# Patient Record
Sex: Female | Born: 1955 | Race: Black or African American | Hispanic: No | Smoking: Never smoker
Health system: Southern US, Community
[De-identification: ages and names within clinical notes are randomized; demographics above are authoritative.]

## PROBLEM LIST (undated history)

## (undated) DIAGNOSIS — M1990 Unspecified osteoarthritis, unspecified site: Secondary | ICD-10-CM

## (undated) DIAGNOSIS — C50919 Malignant neoplasm of unspecified site of unspecified female breast: Secondary | ICD-10-CM

## (undated) DIAGNOSIS — IMO0001 Reserved for inherently not codable concepts without codable children: Secondary | ICD-10-CM

## (undated) DIAGNOSIS — E039 Hypothyroidism, unspecified: Secondary | ICD-10-CM

## (undated) DIAGNOSIS — Z789 Other specified health status: Secondary | ICD-10-CM

## (undated) HISTORY — PX: COLONOSCOPY: SHX174

## (undated) HISTORY — PX: BREAST EXCISIONAL BIOPSY: SUR124

## (undated) HISTORY — PX: TUBAL LIGATION: SHX77

## (undated) HISTORY — PX: OTHER SURGICAL HISTORY: SHX169

## (undated) HISTORY — PX: INGUINAL HERNIA REPAIR: SHX194

---

## 1998-06-01 ENCOUNTER — Ambulatory Visit (HOSPITAL_COMMUNITY): Admission: RE | Admit: 1998-06-01 | Discharge: 1998-06-01 | Payer: Self-pay | Admitting: Endocrinology

## 1999-04-24 ENCOUNTER — Ambulatory Visit (HOSPITAL_COMMUNITY): Admission: RE | Admit: 1999-04-24 | Discharge: 1999-04-24 | Payer: Self-pay | Admitting: Obstetrics and Gynecology

## 1999-04-24 ENCOUNTER — Encounter: Payer: Self-pay | Admitting: Obstetrics and Gynecology

## 1999-12-27 ENCOUNTER — Other Ambulatory Visit: Admission: RE | Admit: 1999-12-27 | Discharge: 1999-12-27 | Payer: Self-pay | Admitting: Obstetrics and Gynecology

## 2000-06-27 ENCOUNTER — Other Ambulatory Visit: Admission: RE | Admit: 2000-06-27 | Discharge: 2000-06-27 | Payer: Self-pay | Admitting: Obstetrics and Gynecology

## 2000-07-01 ENCOUNTER — Encounter: Payer: Self-pay | Admitting: Obstetrics and Gynecology

## 2000-07-01 ENCOUNTER — Ambulatory Visit (HOSPITAL_COMMUNITY): Admission: RE | Admit: 2000-07-01 | Discharge: 2000-07-01 | Payer: Self-pay | Admitting: Obstetrics and Gynecology

## 2001-07-11 ENCOUNTER — Other Ambulatory Visit: Admission: RE | Admit: 2001-07-11 | Discharge: 2001-07-11 | Payer: Self-pay | Admitting: Obstetrics and Gynecology

## 2001-08-15 ENCOUNTER — Ambulatory Visit (HOSPITAL_COMMUNITY): Admission: RE | Admit: 2001-08-15 | Discharge: 2001-08-15 | Payer: Self-pay | Admitting: Obstetrics and Gynecology

## 2001-08-15 ENCOUNTER — Encounter: Payer: Self-pay | Admitting: Obstetrics and Gynecology

## 2002-09-23 ENCOUNTER — Other Ambulatory Visit: Admission: RE | Admit: 2002-09-23 | Discharge: 2002-09-23 | Payer: Self-pay | Admitting: Obstetrics and Gynecology

## 2003-02-24 ENCOUNTER — Ambulatory Visit (HOSPITAL_COMMUNITY): Admission: RE | Admit: 2003-02-24 | Discharge: 2003-02-24 | Payer: Self-pay | Admitting: Obstetrics and Gynecology

## 2003-02-24 ENCOUNTER — Encounter: Payer: Self-pay | Admitting: Obstetrics and Gynecology

## 2003-12-13 ENCOUNTER — Other Ambulatory Visit: Admission: RE | Admit: 2003-12-13 | Discharge: 2003-12-13 | Payer: Self-pay | Admitting: Obstetrics and Gynecology

## 2005-01-18 ENCOUNTER — Other Ambulatory Visit: Admission: RE | Admit: 2005-01-18 | Discharge: 2005-01-18 | Payer: Self-pay | Admitting: Obstetrics and Gynecology

## 2013-11-19 HISTORY — PX: BREAST LUMPECTOMY: SHX2

## 2014-04-06 ENCOUNTER — Other Ambulatory Visit: Payer: Self-pay | Admitting: Family Medicine

## 2014-04-06 DIAGNOSIS — M25559 Pain in unspecified hip: Secondary | ICD-10-CM

## 2014-04-15 ENCOUNTER — Ambulatory Visit
Admission: RE | Admit: 2014-04-15 | Discharge: 2014-04-15 | Disposition: A | Discharge status: Home / Self Care | Payer: Self-pay | Source: Ambulatory Visit | Attending: Family Medicine | Admitting: Family Medicine

## 2014-04-15 DIAGNOSIS — M25559 Pain in unspecified hip: Secondary | ICD-10-CM

## 2014-06-02 ENCOUNTER — Other Ambulatory Visit: Payer: Self-pay | Admitting: Obstetrics and Gynecology

## 2014-06-02 DIAGNOSIS — R928 Other abnormal and inconclusive findings on diagnostic imaging of breast: Secondary | ICD-10-CM

## 2014-06-10 ENCOUNTER — Other Ambulatory Visit: Payer: Self-pay | Admitting: Obstetrics and Gynecology

## 2014-06-10 ENCOUNTER — Ambulatory Visit
Admission: RE | Admit: 2014-06-10 | Discharge: 2014-06-10 | Disposition: A | Discharge status: Home / Self Care | Payer: Federal, State, Local not specified - PPO | Source: Ambulatory Visit | Attending: Obstetrics and Gynecology | Admitting: Obstetrics and Gynecology

## 2014-06-10 DIAGNOSIS — R921 Mammographic calcification found on diagnostic imaging of breast: Secondary | ICD-10-CM

## 2014-06-10 DIAGNOSIS — R928 Other abnormal and inconclusive findings on diagnostic imaging of breast: Secondary | ICD-10-CM

## 2014-06-18 ENCOUNTER — Ambulatory Visit
Admission: RE | Admit: 2014-06-18 | Discharge: 2014-06-18 | Disposition: A | Discharge status: Home / Self Care | Payer: Federal, State, Local not specified - PPO | Source: Ambulatory Visit | Attending: Obstetrics and Gynecology | Admitting: Obstetrics and Gynecology

## 2014-06-18 DIAGNOSIS — R921 Mammographic calcification found on diagnostic imaging of breast: Secondary | ICD-10-CM

## 2014-06-21 ENCOUNTER — Other Ambulatory Visit: Payer: Self-pay | Admitting: Obstetrics and Gynecology

## 2014-06-21 DIAGNOSIS — C50919 Malignant neoplasm of unspecified site of unspecified female breast: Secondary | ICD-10-CM

## 2014-06-22 ENCOUNTER — Telehealth: Payer: Self-pay | Admitting: *Deleted

## 2014-06-22 DIAGNOSIS — C50411 Malignant neoplasm of upper-outer quadrant of right female breast: Secondary | ICD-10-CM | POA: Insufficient documentation

## 2014-06-22 NOTE — Telephone Encounter (Signed)
Confirmed [facility] for 07/07/14 at 1230pm .  Instructions and contact information given.  Patient states she had labs done a week ago and will bring them with her.  No lab appointment made.

## 2014-06-28 ENCOUNTER — Other Ambulatory Visit: Payer: Self-pay

## 2014-07-07 ENCOUNTER — Encounter: Payer: Self-pay | Admitting: Hematology and Oncology

## 2014-07-07 ENCOUNTER — Encounter: Payer: Self-pay | Admitting: General Practice

## 2014-07-07 ENCOUNTER — Ambulatory Visit: Payer: Federal, State, Local not specified - PPO

## 2014-07-07 ENCOUNTER — Ambulatory Visit
Admission: RE | Admit: 2014-07-07 | Discharge: 2014-07-07 | Disposition: A | Discharge status: Home / Self Care | Payer: Federal, State, Local not specified - PPO | Source: Ambulatory Visit | Attending: Radiation Oncology | Admitting: Radiation Oncology

## 2014-07-07 ENCOUNTER — Ambulatory Visit (HOSPITAL_BASED_OUTPATIENT_CLINIC_OR_DEPARTMENT_OTHER): Payer: Federal, State, Local not specified - PPO | Admitting: Surgery

## 2014-07-07 ENCOUNTER — Ambulatory Visit (HOSPITAL_BASED_OUTPATIENT_CLINIC_OR_DEPARTMENT_OTHER): Payer: Federal, State, Local not specified - PPO | Admitting: Hematology and Oncology

## 2014-07-07 VITALS — BP 162/76 | HR 68 | Temp 97.6°F | Resp 18 | Ht 66.5 in | Wt 232.6 lb

## 2014-07-07 DIAGNOSIS — Z170 Estrogen receptor positive status [ER+]: Secondary | ICD-10-CM

## 2014-07-07 DIAGNOSIS — C50411 Malignant neoplasm of upper-outer quadrant of right female breast: Secondary | ICD-10-CM

## 2014-07-07 DIAGNOSIS — D0590 Unspecified type of carcinoma in situ of unspecified breast: Secondary | ICD-10-CM

## 2014-07-07 DIAGNOSIS — D0510 Intraductal carcinoma in situ of unspecified breast: Secondary | ICD-10-CM | POA: Insufficient documentation

## 2014-07-07 DIAGNOSIS — D0511 Intraductal carcinoma in situ of right breast: Secondary | ICD-10-CM

## 2014-07-07 NOTE — Progress Notes (Signed)
Checked in new pt with no financial concerns at this time.  I informed pt of the [facility] program and what they assist with.  I gave her my card for any questions or concerns.

## 2014-07-07 NOTE — Progress Notes (Signed)
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 female  Who was found to have calcifications on a screening mammogram which measured 8 mm. 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 She declined an MRI. She was referred to breast clinic.  She has sensitive breasts her whole life and the right breast is particularly sensitive after her biopsy. She is accompanied by her sister. She is GXP3 with her first live birth at the age of 62. Menses began at age 61 and she is perimenopausal with some spotting only over the past 12 months. She is interested in breast conservation.  Her sister tape recorded this conversation.   PREVIOUS RADIATION THERAPY: No  PAST MEDICAL HISTORY:  has a past medical history of Graves' disease.    PAST SURGICAL HISTORY: Past Surgical History  Procedure Laterality Date  . Tubal ligation    . Fibrocystic breast Left     FAMILY HISTORY:  Family History  Problem Relation Age of Onset  . Breast cancer Maternal Aunt   . Liver cancer Maternal Uncle     SOCIAL HISTORY:  History  Substance Use Topics  . Smoking status: Never Smoker   . Smokeless tobacco: Not on file  . Alcohol Use: Yes     Comment: 1-2/week    ALLERGIES: Review of patient's allergies indicates no known allergies.  MEDICATIONS:  No current outpatient prescriptions on file.   No current facility-administered medications for this encounter.    REVIEW OF SYSTEMS:  A 15 point review of  systems is documented in the electronic medical record. This was obtained by the nursing staff. However, I reviewed this with the patient to discuss relevant findings and make appropriate changes.  Pertinent items are noted in HPI.  PHYSICAL EXAM: There were no vitals filed for this visit.. . Pleasant female in no distress. Sensitive breasts with large areola bilaterally. Minimal bruising in the upper outer quadrant  LABORATORY DATA:  No results found for this basename: WBC, HGB, HCT, MCV, PLT   No results found for this basename: NA, K, CL, CO2   No results found for this basename: ALT, AST, GGT, ALKPHOS, BILITOT     RADIOGRAPHY: Mm Digital Diagnostic Unilat R  06/10/2014   CLINICAL DATA:  Call back from screening for right breast calcifications.  EXAM: DIGITAL DIAGNOSTIC  RIGHT MAMMOGRAM  COMPARISON:  Prior exams  ACR Breast Density Category b: There are scattered areas of fibroglandular density.  FINDINGS: A small cluster of calcifications on the right have increased from 2 with 3 faint calcifications in 2014 and no perceptible calcifications the year before that. They appear generally punctate but very in size. There is no branching or linearity. There is no associated mass.  IMPRESSION: Increasing calcifications arranged in a small cluster in the lateral right breast. Suspicious. Biopsy is recommended.  RECOMMENDATION: Stereotactic core needle biopsy of the right breast calcifications. Stereotactic biopsy will be scheduled.  I have discussed the  findings and recommendations with the patient. Results were also provided in writing at the conclusion of the visit. If applicable, a reminder letter will be sent to the patient regarding the next appointment.  BI-RADS CATEGORY  4: Suspicious.   Electronically Signed   By: Lajean Manes M.D.   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 This was found to be concordant by Dr. Luberta Robertson. Pathology was discussed with the patient by telephone and her questions were answered. The patient reported doing well after the biopsy. Post biopsy instructions and care were reviewed. The patient has been scheduled for [facility] on July 07, 2014, and a bilateral breast MRI on June 28, 2014. She was encouraged to come to [facility] for educational materials. My number was provided to her for future questions and concerns.  Pathology results reported by Susa Raring RN, BSN on June 21, 2014.   Electronically Signed   By: Luberta Robertson M.D.   On: 06/21/2014 12:27   06/21/2014   CLINICAL DATA:  Right breast calcifications.  EXAM: Right BREAST STEREOTACTIC CORE NEEDLE BIOPSY  COMPARISON:  Previous exams.  FINDINGS: The patient and I discussed the procedure of stereotactic-guided biopsy including benefits and alternatives. We discussed the high likelihood of a successful procedure. We discussed the risks of the procedure including infection, bleeding, tissue injury, clip migration, and inadequate sampling. Informed written consent was given. The usual time out protocol was performed immediately prior to the procedure.  Using sterile technique and 2% lidocaine as local anesthetic, under stereotactic guidance, a 9 gauge vacuum assisted device was used to perform core needle biopsy of calcifications within the upper outer quadrant of the right breast using a superior craniocaudal approach. Specimen radiograph was performed showing multiple representative calcifications within the specimen. Specimens with calcifications are identified for pathology.  At the conclusion of the procedure, a coil shaped tissue marker clip was deployed into the biopsy cavity. Follow-up 2-view mammogram confirmed clip to be  in appropriate position.  IMPRESSION: Stereotactic-guided biopsy of right breast calcifications as discussed above. No apparent complications.  Electronically Signed: By: Luberta Robertson M.D. On: 06/18/2014 10:14     IMPRESSION: Stage 0 right breast cancer  PLAN:I spoke to the patient today regarding her diagnosis and options for treatment.  We discussed the role of radiation in decreasing local failures in patients who undergo lumpectomy. We discussed the process of simulation and the placement tattoos. We discussed 4-6 weeks of treatment as an outpatient. We discussed the possibility of asymptomatic lung damage. We discussed the low likelihood of secondary malignancies. We discussed the possible side effects including but not limited to skin redness, fatigue, permanent skin darkening, and breast swelling.  She met with medical oncology as well as a member of our patient family support team. I will plan on seeing her back after her surgery.  I spent 40 minutes face to face with the patient and more than 50% of that time was spent in counseling and/or coordination of care.     ------------------------------------------------  Thea Silversmith, MD

## 2014-07-07 NOTE — Assessment & Plan Note (Signed)
Right breast high-grade DCIS: ER 100% PR 100% positive. I reviewed the pathology with her in great detail including the significance of ER and PR positivity. I agree with the current treatment plan of lumpectomy followed by radiation followed by antiestrogen therapy.  We discussed briefly the risks and benefits of antiestrogen therapy but I will spend more time discussing the history and options once she completes radiation therapy.

## 2014-07-07 NOTE — Progress Notes (Signed)
Patient ID: Paula Parks, female   DOB: [birth year], 58 y.o.   MRN: [id number]  No chief complaint on file.   HPI Paula Parks is a 58 y.o. female.  Pt sent at the request of Anda Kraft, MD for right breast microcalcifications found on mammogram.  Bx done and showed DCIS high grade 8 mm upper outer quadrant.  Denies breast pain,  Mass or discharge.   HPI  Past Medical History  Diagnosis Date  . Graves' disease     Past Surgical History  Procedure Laterality Date  . Tubal ligation    . Fibrocystic breast Left     Family History  Problem Relation Age of Onset  . Breast cancer Maternal Aunt   . Liver cancer Maternal Uncle     Social History History  Substance Use Topics  . Smoking status: Never Smoker   . Smokeless tobacco: Not on file  . Alcohol Use: Yes     Comment: 1-2/week    No Known Allergies  No current outpatient prescriptions on file.   No current facility-administered medications for this visit.    Review of Systems Review of Systems  Constitutional: Negative for fever, chills and unexpected weight change.  HENT: Negative for congestion, hearing loss, sore throat, trouble swallowing and voice change.   Eyes: Negative for visual disturbance.  Respiratory: Negative for cough and wheezing.   Cardiovascular: Negative for chest pain, palpitations and leg swelling.  Gastrointestinal: Negative for nausea, vomiting, abdominal pain, diarrhea, constipation, blood in stool, abdominal distention and anal bleeding.  Genitourinary: Negative for hematuria, vaginal bleeding and difficulty urinating.  Musculoskeletal: Negative for arthralgias.  Skin: Negative for rash and wound.  Neurological: Negative for seizures, syncope and headaches.  Hematological: Negative for adenopathy. Does not bruise/bleed easily.  Psychiatric/Behavioral: Negative for confusion.    There were no vitals taken for this visit.  Physical Exam Physical Exam  Constitutional: She is  oriented to person, place, and time. She appears well-developed and well-nourished.  HENT:  Head: Normocephalic and atraumatic.  Eyes: Pupils are equal, round, and reactive to light. No scleral icterus.  Neck: Normal range of motion. Neck supple.  Cardiovascular: Normal rate and regular rhythm.   Pulmonary/Chest: Effort normal. Right breast exhibits no inverted nipple, no mass, no nipple discharge, no skin change and no tenderness. Left breast exhibits no inverted nipple, no mass, no nipple discharge, no skin change and no tenderness. Breasts are symmetrical.  Musculoskeletal: Normal range of motion.  Neurological: She is alert and oriented to person, place, and time.  Skin: Skin is warm and dry.  Psychiatric: She has a normal mood and affect. Her behavior is normal. Judgment and thought content normal.    Data Reviewed CLINICAL DATA: Call back from screening for right breast  calcifications.  EXAM:  DIGITAL DIAGNOSTIC RIGHT MAMMOGRAM  COMPARISON: Prior exams  ACR Breast Density Category b: There are scattered areas of  fibroglandular density.  FINDINGS:  A small cluster of calcifications on the right have increased from 2  with 3 faint calcifications in 2014 and no perceptible  calcifications the year before that. They appear generally punctate  but very in size. There is no branching or linearity. There is no  associated mass.  IMPRESSION:  Increasing calcifications arranged in a small cluster in the lateral  right breast. Suspicious. Biopsy is recommended.  RECOMMENDATION:  Stereotactic core needle biopsy of the right breast calcifications.  Stereotactic biopsy will be scheduled.  I have discussed the findings and  recommendations with the patient.  Results were also provided in writing at the conclusion of the  visit. If applicable, a reminder letter will be sent to the patient  regarding the next appointment.  BI-RADS CATEGORY 4: Suspicious.  Electronically Signed  By:  Lajean Manes M.D.  On: 06/10/2014 09:01    Assessment    Right breast DCIS    Plan    Discussed lumpectomy vs mastectomy.  Pt would like breast conservation.  Discussed seed localization.The procedure has been discussed with the patient. Alternatives to surgery have been discussed with the patient.  Risks of surgery include bleeding,  Infection,  Seroma formation, death,  and the need for further surgery.   The patient understands and wishes to proceed.       [name]. 07/07/2014, 3:17 PM

## 2014-07-07 NOTE — Progress Notes (Signed)
[facility] NOTE  Patient Care Team: Anda Kraft, MD as PCP - General (Endocrinology) Rulon Eisenmenger, MD as Consulting Physician (Hematology and Oncology) Joyice Faster. Cornett, MD as Consulting Physician (General Surgery) Thea Silversmith, MD as Consulting Physician (Radiation Oncology) Margarette Asal, MD as Consulting Physician (Obstetrics and Gynecology)  CHIEF COMPLAINTS/PURPOSE OF CONSULTATION:  Newly diagnosed right breast DCIS  HISTORY OF PRESENTING ILLNESS:  Paula Parks 58 y.o. Caucasian female is here because of recent diagnosis of right breast high-grade DCIS. She went for routine mammogram in July that showed increased calcifications that led to a breast ultrasound to confirm that these were suspicious and a biopsy was performed and July 31st that revealed high-grade DCIS ER/PR positive. She was presented this morning in the breast multidisciplinary tumor board the patient is here today to discuss the treatment plan. Slightly sore from the recent biopsy but otherwise without any new complaints concerns. She is anxious to get treatment started. She is accompanied by her sister.  I reviewed her records extensively and collaborated the history with the patient.  SUMMARY OF ONCOLOGIC HISTORY:   Breast cancer of upper-outer quadrant of right female breast   06/10/2014 Breast US Increasing calcifications arranged in a small cluster in the lateral right breast. Suspicious. Biopsy is recommended.    06/10/2014 Mammogram Cluster of calcifications suspicious for malignancy in the upper quadrant of right breast   06/18/2014 Receptors her2 Estrogen Receptor: 100%, POSITIVE, STRONG STAINING INTENSITY Progesterone Receptor: 100%, POSITIVE, STRONG STAINING INTENSITY   06/18/2014 Pathology Results Breast, right, needle core biopsy, 11 o'clock - DUCTAL CARCINOMA IN SITU WITH COMEDO NECROSIS AND CALCIFICATIONS, SEE COMMENT   06/21/2014 Initial Diagnosis Upper-outer quadrant of  right female breast: DCIS high-grade    In terms of breast cancer risk profile:  She menarched at early age of 73 and is perimenopausal  She had 3 pregnancy, her first child was born at age 1  She did not received birth control pills She was ever exposed to fertility medications or hormone replacement therapy.  She has a family history of Breast cancer  MEDICAL HISTORY:  Past Medical History  Diagnosis Date  . Graves' disease     SURGICAL HISTORY: Past Surgical History  Procedure Laterality Date  . Tubal ligation    . Fibrocystic breast Left     SOCIAL HISTORY: History   Social History  . Marital Status: Divorced    Spouse Name: N/A    Number of Children: N/A  . Years of Education: N/A   Occupational History  . Not on file.   Social History Main Topics  . Smoking status: Never Smoker   . Smokeless tobacco: Not on file  . Alcohol Use: Yes     Comment: 1-2/week  . Drug Use: No  . Sexual Activity: Not on file   Other Topics Concern  . Not on file   Social History Narrative  . No narrative on file    FAMILY HISTORY: Family History  Problem Relation Age of Onset  . Breast cancer Maternal Aunt   . Liver cancer Maternal Uncle     ALLERGIES:  has No Known Allergies.  MEDICATIONS: Were reviewed  REVIEW OF SYSTEMS:   Constitutional: Denies fevers, chills or abnormal night sweats Eyes: Denies blurriness of vision, double vision or watery eyes Ears, nose, mouth, throat, and face: Denies mucositis or sore throat Respiratory: Denies cough, dyspnea or wheezes Cardiovascular: Denies palpitation, chest discomfort or lower extremity swelling Gastrointestinal:  Denies  nausea, heartburn or change in bowel habits Skin: Denies abnormal skin rashes Lymphatics: Denies new lymphadenopathy or easy bruising Neurological:Denies numbness, tingling or new weaknesses Behavioral/Psych: Mood is stable, no new changes  Breast: Denies any lumps or nodules All other systems were  reviewed with the patient and are negative.  PHYSICAL EXAMINATION: ECOG PERFORMANCE STATUS: 0 - Asymptomatic  Filed Vitals:   07/07/14 1300  BP: 162/76  Pulse: 68  Temp: 97.6 F (36.4 C)  Resp: 18   Filed Weights   07/07/14 1300  Weight: 232 lb 9.6 oz (105.507 kg)    GENERAL:alert, no distress and comfortable SKIN: skin color, texture, turgor are normal, no rashes or significant lesions EYES: normal, conjunctiva are pink and non-injected, sclera clear OROPHARYNX:no exudate, no erythema and lips, buccal mucosa, and tongue normal  NECK: supple, thyroid normal size, non-tender, without nodularity LYMPH:  no palpable lymphadenopathy in the cervical, axillary or inguinal LUNGS: clear to auscultation and percussion with normal breathing effort HEART: regular rate & rhythm and no murmurs and no lower extremity edema ABDOMEN:abdomen soft, non-tender and normal bowel sounds Musculoskeletal:no cyanosis of digits and no clubbing  PSYCH: alert & oriented x 3 with fluent speech NEURO: no focal motor/sensory deficits BREAST:  LABORATORY DATA:  None  RADIOGRAPHIC STUDIES: I have personally reviewed the radiological reports and agreed with the findings in the report.  ASSESSMENT AND PLAN:  Breast cancer of upper-outer quadrant of right female breast Right breast high-grade DCIS: ER 100% PR 100% positive. I reviewed the pathology with her in great detail including the significance of ER and PR positivity. I agree with the current treatment plan of lumpectomy followed by radiation followed by antiestrogen therapy.  We discussed briefly the risks and benefits of antiestrogen therapy but I will spend more time discussing the history and options once she completes radiation therapy.   All questions were answered. The patient knows to call the clinic with any problems, questions or concerns. I spent 40 minutes counseling the patient face to face. The total time spent in the appointment was 60  minutes and more than 50% was on counseling.     Rulon Eisenmenger, MD 07/07/2014 2:47 PM

## 2014-07-07 NOTE — Progress Notes (Signed)
[facility] Psychosocial Distress Screening Spiritual Care  Visited with Vara to introduce [facility] resources, Spiritual Care, and to review distress screen per protocol.  The patient scored a 7 on the Psychosocial Distress Thermometer which indicates moderate distress. Assessed for distress and other psychosocial needs.  Per pt, her top concern is navigating financial and work arrangements.  (Per pt, she is currently on worker's comp related to hip problems that require further treatment.)  Walked pt to [employer]' offices after clinic.   ONCBCN DISTRESS SCREENING 07/07/2014  Screening Type Initial Screening  [place]ta [place] the number that describes how much distress you have been experiencing in the past week 7  Practical problem type Insurance;Work/school;Food  Emotional problem type Nervousness/Anxiety;Adjusting to illness  Information Concerns Type Lack of info about treatment;Lack of info about complementary therapy choices;Lack of info about maintaining fitness  Physical Problem type Getting around  Referral to financial advocate Yes  Other Spiritual Care    Spiritual follow up needed: Yes.    If yes, follow up plan:  Phone pt to offer further emotional support and encouragement as she begins to address her needs with work.  [place], [place]

## 2014-07-07 NOTE — Patient Instructions (Signed)
Lumpectomy A lumpectomy is a form of "breast conserving" or "breast preservation" surgery. It may also be referred to as a partial mastectomy. During a lumpectomy, the portion of the breast that contains the cancerous tumor or breast mass (the lump) is removed. Some normal tissue around the lump may also be removed to make sure all of the tumor has been removed.  LET YOUR HEALTH CARE PROVIDER KNOW ABOUT:  Any allergies you have.  All medicines you are taking, including vitamins, herbs, eye drops, creams, and over-the-counter medicines.  Previous problems you or members of your family have had with the use of anesthetics.  Any blood disorders you have.  Previous surgeries you have had.  Medical conditions you have. RISKS AND COMPLICATIONS Generally, this is a safe procedure. However, problems can occur and include:  Bleeding.  Infection.  Pain.  Temporary swelling.  Change in the shape of the breast, particularly if a large portion is removed. BEFORE THE PROCEDURE  Ask your health care provider about changing or stopping your regular medicines. This is especially important if you are taking diabetes medicines or blood thinners.  Do not eat or drink anything after midnight on the night before the procedure or as directed by your health care provider. Ask your health care provider if you can take a sip of water with any approved medicines.  On the day of surgery, your health care provider will use a mammogram or ultrasound to locate and mark the tumor in your breast. These markings on your breast will show where the cut (incision) will be made. PROCEDURE   An IV tube will be put into one of your veins.  You may be given medicine to help you relax before the surgery (sedative). You will be given one of the following:  A medicine that numbs the area (local anesthetic).  A medicine that makes you fall asleep (general anesthetic).  Your health care provider will use a kind of  electric scalpel that uses heat to minimize bleeding (electrocautery knife).  A curved incision (like a smile or frown) that follows the natural curve of your breast is made, to allow for minimal scarring and better healing.  The tumor will be removed with some of the surrounding tissue. This will be sent to the lab for analysis. Your health care provider may also remove your lymph nodes at this time if needed.  Sometimes, but not always, a rubber tube called a drain will be surgically inserted into your breast area or armpit to collect excess fluid that may accumulate in the space where the tumor was. This drain is connected to a plastic bulb on the outside of your body. This drain creates suction to help remove the fluid.  The incisions will be closed with stitches (sutures).  A bandage may be placed over the incisions. AFTER THE PROCEDURE  You will be taken to the recovery area.  You will be given medicine for pain.  A small rubber drain may be placed in the breast for 2-3 days to prevent a collection of blood (hematoma) from developing in the breast. You will be given instructions on caring for the drain before you go home.  A pressure bandage (dressing) will be applied for 1-2 days to prevent bleeding. Ask your health care provider how to care for your bandage at home. Document Released: 12/17/2006 Document Revised: 03/22/2014 Document Reviewed: 04/10/2013 ExitCare Patient Information 2015 [employer]. This information is not intended to replace advice given to you by   your health care provider. Make sure you discuss any questions you have with your health care provider.  

## 2014-07-09 ENCOUNTER — Encounter: Payer: Self-pay | Admitting: General Practice

## 2014-07-09 NOTE — Progress Notes (Signed)
Left f/u voice mail (met at breast clinic 8/19), offering emotional support and encouragement to reach out to chaplain anytime.  [place], [place]

## 2014-07-14 ENCOUNTER — Encounter: Payer: Self-pay | Admitting: *Deleted

## 2014-07-14 ENCOUNTER — Telehealth: Payer: Self-pay | Admitting: Hematology and Oncology

## 2014-07-14 NOTE — Telephone Encounter (Signed)
, °

## 2014-07-15 ENCOUNTER — Other Ambulatory Visit (INDEPENDENT_AMBULATORY_CARE_PROVIDER_SITE_OTHER): Payer: Self-pay | Admitting: Surgery

## 2014-07-15 ENCOUNTER — Telehealth: Payer: Self-pay | Admitting: *Deleted

## 2014-07-15 DIAGNOSIS — D0511 Intraductal carcinoma in situ of right breast: Secondary | ICD-10-CM

## 2014-07-15 NOTE — Telephone Encounter (Signed)
Spoke to pt concerning [place] from 07/07/14. Pt denies questions or concerns regarding dx or treatment care plan. She will return call when she has a moment to discuss other questions she has. Contact information given.

## 2014-08-02 ENCOUNTER — Encounter (HOSPITAL_BASED_OUTPATIENT_CLINIC_OR_DEPARTMENT_OTHER): Payer: Self-pay | Admitting: *Deleted

## 2014-08-02 ENCOUNTER — Encounter (HOSPITAL_BASED_OUTPATIENT_CLINIC_OR_DEPARTMENT_OTHER)
Admission: RE | Admit: 2014-08-02 | Discharge: 2014-08-02 | Disposition: A | Discharge status: Home / Self Care | Payer: Federal, State, Local not specified - PPO | Source: Ambulatory Visit | Attending: Surgery | Admitting: Surgery

## 2014-08-02 ENCOUNTER — Ambulatory Visit
Admission: RE | Admit: 2014-08-02 | Discharge: 2014-08-02 | Disposition: A | Discharge status: Home / Self Care | Payer: Federal, State, Local not specified - PPO | Source: Ambulatory Visit | Attending: Surgery | Admitting: Surgery

## 2014-08-02 DIAGNOSIS — E0500 Thyrotoxicosis with diffuse goiter without thyrotoxic crisis or storm: Secondary | ICD-10-CM | POA: Diagnosis not present

## 2014-08-02 DIAGNOSIS — D0511 Intraductal carcinoma in situ of right breast: Secondary | ICD-10-CM

## 2014-08-02 DIAGNOSIS — C50919 Malignant neoplasm of unspecified site of unspecified female breast: Secondary | ICD-10-CM | POA: Diagnosis present

## 2014-08-02 LAB — COMPREHENSIVE METABOLIC PANEL
ALK PHOS: 70 U/L (ref 39–117)
ALT: 20 U/L (ref 0–35)
AST: 18 U/L (ref 0–37)
Albumin: 3.5 g/dL (ref 3.5–5.2)
Anion gap: 12 (ref 5–15)
BUN: 14 mg/dL (ref 6–23)
CALCIUM: 8.9 mg/dL (ref 8.4–10.5)
CO2: 25 mEq/L (ref 19–32)
Chloride: 104 mEq/L (ref 96–112)
Creatinine, Ser: 0.73 mg/dL (ref 0.50–1.10)
GFR calc Af Amer: >90 mL/min (ref >90)
GFR calc non Af Amer: >90 mL/min (ref >90)
Glucose, Bld: 94 mg/dL (ref 70–99)
POTASSIUM: 4.3 meq/L (ref 3.7–5.3)
SODIUM: 141 meq/L (ref 137–147)
TOTAL PROTEIN: 7.4 g/dL (ref 6.0–8.3)
Total Bilirubin: 0.7 mg/dL (ref 0.3–1.2)

## 2014-08-02 LAB — CBC WITH DIFFERENTIAL/PLATELET
BASOS ABS: 0 10*3/uL (ref 0.0–0.1)
Basophils Relative: 1 % (ref 0–1)
EOS PCT: 1 % (ref 0–5)
Eosinophils Absolute: 0.1 10*3/uL (ref 0.0–0.7)
HCT: 42.3 % (ref 36.0–46.0)
Hemoglobin: 14.4 g/dL (ref 12.0–15.0)
LYMPHS ABS: 2.2 10*3/uL (ref 0.7–4.0)
Lymphocytes Relative: 36 % (ref 12–46)
MCH: 29.1 pg (ref 26.0–34.0)
MCHC: 34 g/dL (ref 30.0–36.0)
MCV: 85.5 fL (ref 78.0–100.0)
Monocytes Absolute: 0.6 10*3/uL (ref 0.1–1.0)
Monocytes Relative: 9 % (ref 3–12)
NEUTROS PCT: 53 % (ref 43–77)
Neutro Abs: 3.3 10*3/uL (ref 1.7–7.7)
PLATELETS: 282 10*3/uL (ref 150–400)
RBC: 4.95 MIL/uL (ref 3.87–5.11)
RDW: 13 % (ref 11.5–15.5)
WBC: 6.1 10*3/uL (ref 4.0–10.5)

## 2014-08-02 NOTE — Progress Notes (Signed)
To come in for labs  

## 2014-08-04 ENCOUNTER — Encounter (HOSPITAL_BASED_OUTPATIENT_CLINIC_OR_DEPARTMENT_OTHER)
Admission: RE | Disposition: A | Discharge status: Home / Self Care | Payer: Self-pay | Source: Ambulatory Visit | Attending: Surgery

## 2014-08-04 ENCOUNTER — Ambulatory Visit (HOSPITAL_BASED_OUTPATIENT_CLINIC_OR_DEPARTMENT_OTHER)
Admission: RE | Admit: 2014-08-04 | Discharge: 2014-08-04 | Disposition: A | Discharge status: Home / Self Care | Payer: Federal, State, Local not specified - PPO | Source: Ambulatory Visit | Attending: Surgery | Admitting: Surgery

## 2014-08-04 ENCOUNTER — Encounter (HOSPITAL_BASED_OUTPATIENT_CLINIC_OR_DEPARTMENT_OTHER): Payer: Self-pay | Admitting: *Deleted

## 2014-08-04 ENCOUNTER — Encounter (HOSPITAL_BASED_OUTPATIENT_CLINIC_OR_DEPARTMENT_OTHER): Payer: Federal, State, Local not specified - PPO | Admitting: Anesthesiology

## 2014-08-04 ENCOUNTER — Ambulatory Visit (HOSPITAL_BASED_OUTPATIENT_CLINIC_OR_DEPARTMENT_OTHER): Payer: Federal, State, Local not specified - PPO | Admitting: Anesthesiology

## 2014-08-04 ENCOUNTER — Ambulatory Visit
Admission: RE | Admit: 2014-08-04 | Discharge: 2014-08-04 | Disposition: A | Discharge status: Home / Self Care | Payer: Federal, State, Local not specified - PPO | Source: Ambulatory Visit | Attending: Surgery | Admitting: Surgery

## 2014-08-04 DIAGNOSIS — C50919 Malignant neoplasm of unspecified site of unspecified female breast: Secondary | ICD-10-CM | POA: Diagnosis not present

## 2014-08-04 DIAGNOSIS — D0511 Intraductal carcinoma in situ of right breast: Secondary | ICD-10-CM

## 2014-08-04 DIAGNOSIS — E0500 Thyrotoxicosis with diffuse goiter without thyrotoxic crisis or storm: Secondary | ICD-10-CM | POA: Insufficient documentation

## 2014-08-04 HISTORY — DX: Other specified health status: Z78.9

## 2014-08-04 HISTORY — DX: Reserved for inherently not codable concepts without codable children: IMO0001

## 2014-08-04 HISTORY — PX: BREAST LUMPECTOMY WITH RADIOACTIVE SEED LOCALIZATION: SHX6424

## 2014-08-04 HISTORY — DX: Hypothyroidism, unspecified: E03.9

## 2014-08-04 HISTORY — DX: Unspecified osteoarthritis, unspecified site: M19.90

## 2014-08-04 SURGERY — BREAST LUMPECTOMY WITH RADIOACTIVE SEED LOCALIZATION
Anesthesia: General | Site: Breast | Laterality: Right

## 2014-08-04 MED ORDER — CEFAZOLIN SODIUM-DEXTROSE 2-3 GM-% IV SOLR
INTRAVENOUS | Status: AC
  Filled 2014-08-04: qty 50

## 2014-08-04 MED ORDER — OXYCODONE HCL 5 MG PO TABS: 5.0000 mg | ORAL_TABLET | Freq: Once | ORAL | Status: DC | PRN

## 2014-08-04 MED ORDER — PROPOFOL 10 MG/ML IV BOLUS
INTRAVENOUS | Status: AC
  Filled 2014-08-04: qty 20

## 2014-08-04 MED ORDER — LACTATED RINGERS IV SOLN
INTRAVENOUS | Status: DC
Start: 2014-08-04 — End: 2014-08-04
  Administered 2014-08-04 (×2): via INTRAVENOUS

## 2014-08-04 MED ORDER — MIDAZOLAM HCL 5 MG/5ML IJ SOLN
INTRAMUSCULAR | Status: DC | PRN
  Administered 2014-08-04: 2 mg via INTRAVENOUS

## 2014-08-04 MED ORDER — FENTANYL CITRATE 0.05 MG/ML IJ SOLN
INTRAMUSCULAR | Status: DC | PRN
  Administered 2014-08-04: 100 ug via INTRAVENOUS
  Administered 2014-08-04: 50 ug via INTRAVENOUS

## 2014-08-04 MED ORDER — MIDAZOLAM HCL 2 MG/2ML IJ SOLN: 1.0000 mg | INTRAMUSCULAR | Status: DC | PRN

## 2014-08-04 MED ORDER — HYDROMORPHONE HCL PF 1 MG/ML IJ SOLN: 0.2500 mg | INTRAMUSCULAR | Status: DC | PRN

## 2014-08-04 MED ORDER — OXYCODONE-ACETAMINOPHEN 5-325 MG PO TABS: 1.0000 | ORAL_TABLET | ORAL | Status: DC | PRN

## 2014-08-04 MED ORDER — FENTANYL CITRATE 0.05 MG/ML IJ SOLN: 50.0000 ug | INTRAMUSCULAR | Status: DC | PRN

## 2014-08-04 MED ORDER — SCOPOLAMINE 1 MG/3DAYS TD PT72
1.0000 | MEDICATED_PATCH | TRANSDERMAL | Status: DC
  Administered 2014-08-04: 1 via TRANSDERMAL

## 2014-08-04 MED ORDER — CEFAZOLIN SODIUM-DEXTROSE 2-3 GM-% IV SOLR
2.0000 g | INTRAVENOUS | Status: AC
  Administered 2014-08-04: 2 g via INTRAVENOUS

## 2014-08-04 MED ORDER — PROPOFOL 10 MG/ML IV BOLUS
INTRAVENOUS | Status: DC | PRN
  Administered 2014-08-04: 200 mg via INTRAVENOUS

## 2014-08-04 MED ORDER — MIDAZOLAM HCL 2 MG/2ML IJ SOLN
INTRAMUSCULAR | Status: AC
  Filled 2014-08-04: qty 2

## 2014-08-04 MED ORDER — DEXAMETHASONE SODIUM PHOSPHATE 4 MG/ML IJ SOLN
INTRAMUSCULAR | Status: DC | PRN
  Administered 2014-08-04: 10 mg via INTRAVENOUS

## 2014-08-04 MED ORDER — BUPIVACAINE-EPINEPHRINE (PF) 0.25% -1:200000 IJ SOLN
INTRAMUSCULAR | Status: DC | PRN
  Administered 2014-08-04: 10 mL

## 2014-08-04 MED ORDER — FENTANYL CITRATE 0.05 MG/ML IJ SOLN
INTRAMUSCULAR | Status: AC
  Filled 2014-08-04: qty 4

## 2014-08-04 MED ORDER — OXYCODONE HCL 5 MG/5ML PO SOLN: 5.0000 mg | Freq: Once | ORAL | Status: DC | PRN

## 2014-08-04 MED ORDER — BUPIVACAINE-EPINEPHRINE (PF) 0.25% -1:200000 IJ SOLN
INTRAMUSCULAR | Status: AC
  Filled 2014-08-04: qty 30

## 2014-08-04 MED ORDER — LIDOCAINE HCL (CARDIAC) 20 MG/ML IV SOLN
INTRAVENOUS | Status: DC | PRN
  Administered 2014-08-04: 80 mg via INTRAVENOUS

## 2014-08-04 MED ORDER — ONDANSETRON HCL 4 MG/2ML IJ SOLN: 4.0000 mg | Freq: Once | INTRAMUSCULAR | Status: DC | PRN

## 2014-08-04 MED ORDER — CHLORHEXIDINE GLUCONATE 4 % EX LIQD: 1.0000 "application " | Freq: Once | CUTANEOUS | Status: DC

## 2014-08-04 MED ORDER — SCOPOLAMINE 1 MG/3DAYS TD PT72
MEDICATED_PATCH | TRANSDERMAL | Status: AC
  Filled 2014-08-04: qty 1

## 2014-08-04 MED ORDER — EPHEDRINE SULFATE 50 MG/ML IJ SOLN
INTRAMUSCULAR | Status: DC | PRN
  Administered 2014-08-04 (×2): 10 mg via INTRAVENOUS

## 2014-08-04 MED ORDER — ONDANSETRON HCL 4 MG/2ML IJ SOLN
INTRAMUSCULAR | Status: DC | PRN
  Administered 2014-08-04: 4 mg via INTRAVENOUS

## 2014-08-04 SURGICAL SUPPLY — 49 items
APPLIER CLIP 9.375 MED OPEN (MISCELLANEOUS)
BINDER BREAST LRG (GAUZE/BANDAGES/DRESSINGS) IMPLANT
BINDER BREAST MEDIUM (GAUZE/BANDAGES/DRESSINGS) IMPLANT
BINDER BREAST XLRG (GAUZE/BANDAGES/DRESSINGS) ×2 IMPLANT
BINDER BREAST XXLRG (GAUZE/BANDAGES/DRESSINGS) IMPLANT
BLADE SURG 15 STRL LF DISP TIS (BLADE) ×1 IMPLANT
BLADE SURG 15 STRL SS (BLADE) ×1
CANISTER SUC SOCK COL 7IN (MISCELLANEOUS) ×2 IMPLANT
CANISTER SUCT 1200ML W/VALVE (MISCELLANEOUS) IMPLANT
CHLORAPREP W/TINT 26ML (MISCELLANEOUS) ×2 IMPLANT
CLIP APPLIE 9.375 MED OPEN (MISCELLANEOUS) IMPLANT
CLIP TI WIDE RED SMALL 6 (CLIP) ×2 IMPLANT
COVER MAYO STAND STRL (DRAPES) ×2 IMPLANT
COVER PROBE W GEL 5X96 (DRAPES) ×2 IMPLANT
COVER TABLE BACK 60X90 (DRAPES) ×2 IMPLANT
DECANTER SPIKE VIAL GLASS SM (MISCELLANEOUS) IMPLANT
DERMABOND ADVANCED (GAUZE/BANDAGES/DRESSINGS) ×1
DERMABOND ADVANCED .7 DNX12 (GAUZE/BANDAGES/DRESSINGS) ×1 IMPLANT
DEVICE DUBIN W/COMP PLATE 8390 (MISCELLANEOUS) ×2 IMPLANT
DRAPE LAPAROSCOPIC ABDOMINAL (DRAPES) IMPLANT
DRAPE PED LAPAROTOMY (DRAPES) ×2 IMPLANT
DRAPE UTILITY XL STRL (DRAPES) ×2 IMPLANT
ELECT COATED BLADE 2.86 ST (ELECTRODE) ×2 IMPLANT
ELECT REM PT RETURN 9FT ADLT (ELECTROSURGICAL) ×2
ELECTRODE REM PT RTRN 9FT ADLT (ELECTROSURGICAL) ×1 IMPLANT
GLOVE BIO SURGEON STRL SZ7 (GLOVE) ×2 IMPLANT
GLOVE BIOGEL PI IND STRL 8 (GLOVE) ×1 IMPLANT
GLOVE BIOGEL PI INDICATOR 8 (GLOVE) ×1
GLOVE ECLIPSE 7.0 STRL STRAW (GLOVE) ×4 IMPLANT
GLOVE ECLIPSE 8.0 STRL XLNG CF (GLOVE) ×2 IMPLANT
GOWN STRL REUS W/ TWL LRG LVL3 (GOWN DISPOSABLE) ×2 IMPLANT
GOWN STRL REUS W/TWL LRG LVL3 (GOWN DISPOSABLE) ×2
KIT MARKER MARGIN INK (KITS) ×2 IMPLANT
NEEDLE HYPO 25X1 1.5 SAFETY (NEEDLE) ×2 IMPLANT
NS IRRIG 1000ML POUR BTL (IV SOLUTION) IMPLANT
PACK BASIN DAY SURGERY FS (CUSTOM PROCEDURE TRAY) ×2 IMPLANT
PENCIL BUTTON HOLSTER BLD 10FT (ELECTRODE) ×2 IMPLANT
SLEEVE SCD COMPRESS KNEE MED (MISCELLANEOUS) ×2 IMPLANT
SPONGE LAP 4X18 X RAY DECT (DISPOSABLE) ×2 IMPLANT
STAPLER VISISTAT 35W (STAPLE) IMPLANT
SUT MNCRL AB 4-0 PS2 18 (SUTURE) ×2 IMPLANT
SUT SILK 2 0 SH (SUTURE) IMPLANT
SUT VIC AB 3-0 SH 27 (SUTURE) ×1
SUT VIC AB 3-0 SH 27X BRD (SUTURE) ×1 IMPLANT
SYR CONTROL 10ML LL (SYRINGE) ×2 IMPLANT
TOWEL OR 17X24 6PK STRL BLUE (TOWEL DISPOSABLE) ×2 IMPLANT
TOWEL OR NON WOVEN STRL DISP B (DISPOSABLE) ×2 IMPLANT
TUBE CONNECTING 20X1/4 (TUBING) IMPLANT
YANKAUER SUCT BULB TIP NO VENT (SUCTIONS) IMPLANT

## 2014-08-04 NOTE — Anesthesia Preprocedure Evaluation (Addendum)
Anesthesia Evaluation  Patient identified by MRN, date of birth, ID band Patient awake    Reviewed: Allergy & Precautions, H&P , NPO status , Patient's Chart, lab work & pertinent test results  History of Anesthesia Complications Negative for: history of anesthetic complications  Airway Mallampati: I TM Distance: >3 FB Neck ROM: Full    Dental  (+) Teeth Intact, Dental Advisory Given   Pulmonary neg pulmonary ROS,  breath sounds clear to auscultation        Cardiovascular negative cardio ROS  Rhythm:Regular Rate:Normal     Neuro/Psych negative neurological ROS  negative psych ROS   GI/Hepatic   Endo/Other    Renal/GU      Musculoskeletal   Abdominal   Peds  Hematology   Anesthesia Other Findings   Reproductive/Obstetrics                          Anesthesia Physical Anesthesia Plan  ASA: II  Anesthesia Plan: General   Post-op Pain Management:    Induction: Intravenous  Airway Management Planned: LMA  Additional Equipment:   Intra-op Plan:   Post-operative Plan: Extubation in OR  Informed Consent: I have reviewed the patients History and Physical, chart, labs and discussed the procedure including the risks, benefits and alternatives for the proposed anesthesia with the patient or authorized representative who has indicated his/her understanding and acceptance.   Dental advisory given  Plan Discussed with: CRNA and Anesthesiologist  Anesthesia Plan Comments:         Anesthesia Quick Evaluation

## 2014-08-04 NOTE — Interval H&P Note (Signed)
History and Physical Interval Note:  08/04/2014 8:03 AM  Paula Parks  has presented today for surgery, with the diagnosis of right breast dcis  The various methods of treatment have been discussed with the patient and family. After consideration of risks, benefits and other options for treatment, the patient has consented to  Procedure(s): RIGHT BREAST SEED LOCALIZED  LUMPECTOMY  (Right) as a surgical intervention .  The patient's history has been reviewed, patient examined, no change in status, stable for surgery.  I have reviewed the patient's chart and labs.  Questions were answered to the patient's satisfaction.     Naftali Carchi A.

## 2014-08-04 NOTE — Op Note (Signed)
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  Anesthesia: Gen. With 0.25% Sensorcaine local with epinephrine  EBL: 20 cc  Specimen: right breast tissue with clip and radioactive seed in the specimen. Verified with neoprobe and radiographic image showing both seed and clip in specimen  Indications for procedure: The patient presents for right  breast excisional lumpectomy after core biopsy showed DCIS. Discussed the rationale for considering excision.. Discussed mastectomy. Discussed SEED localization. Patient desired excision of left breast papilloma.The procedure has been discussed with the patient. Alternatives to surgery have been discussed with the patient.  Risks of surgery include bleeding,  Infection,  Seroma formation, death,  and the need for further surgery.   The patient understands and wishes to proceed.   Description of procedure: Patient underwent seed placement as an outpatient. Patient presents today for left breast seed localized lumpectomy. Patient and holding area. Right side marked.  Questions are answered and neoprobe used to verify seed location. Patient taken back to the operating room and placed upon the OR table. After induction of general anesthesia, right breast prepped and draped in a sterile fashion. Timeout was done to verify proper sizing procedure. Neoprobe used and hot spot identified and left breast upper-outer quadrant. This was marked with pen. Curvilinear incision made left upper outer quadrant breast. Dissection used with the help of a neoprobe around the tissue where the seed and clip were located. Tissue removed in its entirety with gross margins.Marlis Edelson used and seen within specimen. Radiographs taken which show clip and seed  In specimen.hemostasis achieved and cavity closed with 3-0 Vicryl and 4-0 Monocryl. Dermabond applied.  All final counts found to be correct. Specimen transported to pathology. Patient awoke extubated taken to recovery in satisfactory condition.

## 2014-08-04 NOTE — Transfer of Care (Signed)
Immediate Anesthesia Transfer of Care Note  Patient: Paula Parks  Procedure(s) Performed: Procedure(s): RIGHT BREAST SEED LOCALIZED  LUMPECTOMY  (Right)  Patient Location: PACU  Anesthesia Type:General  Level of Consciousness: awake, alert  and oriented  Airway & Oxygen Therapy: Patient Spontanous Breathing and Patient connected to face mask oxygen  Post-op Assessment: Report given to PACU RN and Post -op Vital signs reviewed and stable  Post vital signs: Reviewed and stable  Complications: No apparent anesthesia complications

## 2014-08-04 NOTE — Anesthesia Postprocedure Evaluation (Signed)
  Anesthesia Post-op Note  Patient: Paula Parks  Procedure(s) Performed: Procedure(s): RIGHT BREAST SEED LOCALIZED  LUMPECTOMY  (Right)  Patient Location: PACU  Anesthesia Type: General   Level of Consciousness: awake, alert  and oriented  Airway and Oxygen Therapy: Patient Spontanous Breathing  Post-op Pain: mild  Post-op Assessment: Post-op Vital signs reviewed  Post-op Vital Signs: Reviewed  Last Vitals:  Filed Vitals:   08/04/14 0945  BP: 135/114  Pulse: 75  Temp:   Resp: 14    Complications: No apparent anesthesia complications

## 2014-08-04 NOTE — Anesthesia Procedure Notes (Signed)
Procedure Name: LMA Insertion Date/Time: 08/04/2014 8:33 AM Performed by: Maryella Shivers Pre-anesthesia Checklist: Patient identified, Emergency Drugs available, Suction available and Patient being monitored Patient Re-evaluated:Patient Re-evaluated prior to inductionOxygen Delivery Method: Circle System Utilized Preoxygenation: Pre-oxygenation with 100% oxygen Intubation Type: IV induction Ventilation: Mask ventilation without difficulty LMA: LMA inserted LMA Size: 4.0 Number of attempts: 1 Airway Equipment and Method: bite block Placement Confirmation: positive ETCO2 Tube secured with: Tape Dental Injury: Teeth and Oropharynx as per pre-operative assessment

## 2014-08-04 NOTE — Discharge Instructions (Signed)
[facility] °Office Phone Number [phone] ° °BREAST BIOPSY/ PARTIAL MASTECTOMY: POST OP INSTRUCTIONS ° °Always review your discharge instruction sheet given to you by the facility where your surgery was performed. ° °IF YOU HAVE DISABILITY OR FAMILY LEAVE FORMS, YOU MUST BRING THEM TO THE OFFICE FOR PROCESSING.  DO NOT GIVE THEM TO YOUR DOCTOR. ° °1. A prescription for pain medication may be given to you upon discharge.  Take your pain medication as prescribed, if needed.  If narcotic pain medicine is not needed, then you may take acetaminophen (Tylenol) or ibuprofen (Advil) as needed. °2. Take your usually prescribed medications unless otherwise directed °3. If you need a refill on your pain medication, please contact your pharmacy.  They will contact our office to request authorization.  Prescriptions will not be filled after 5pm or on week-ends. °4. You should eat very light the first 24 hours after surgery, such as soup, crackers, pudding, etc.  Resume your normal diet the day after surgery. °5. Most patients will experience some swelling and bruising in the breast.  Ice packs and a good support bra will help.  Swelling and bruising can take several days to resolve.  °6. It is common to experience some constipation if taking pain medication after surgery.  Increasing fluid intake and taking a stool softener will usually help or prevent this problem from occurring.  A mild laxative (Milk of Magnesia or Miralax) should be taken according to package directions if there are no bowel movements after 48 hours. °7. Unless discharge instructions indicate otherwise, you may remove your bandages 24-48 hours after surgery, and you may shower at that time.  You may have steri-strips (small skin tapes) in place directly over the incision.  These strips should be left on the skin for 7-10 days.  If your surgeon used skin glue on the incision, you may shower in 24 hours.  The glue will flake off over the  next 2-3 weeks.  Any sutures or staples will be removed at the office during your follow-up visit. °8. ACTIVITIES:  You may resume regular daily activities (gradually increasing) beginning the next day.  Wearing a good support bra or sports bra minimizes pain and swelling.  You may have sexual intercourse when it is comfortable. °a. You may drive when you no longer are taking prescription pain medication, you can comfortably wear a seatbelt, and you can safely maneuver your car and apply brakes. °b. RETURN TO WORK:  ______________________________________________________________________________________ °9. You should see your doctor in the office for a follow-up appointment approximately two weeks after your surgery.  Your doctor’s nurse will typically make your follow-up appointment when she calls you with your pathology report.  Expect your pathology report 2-3 business days after your surgery.  You may call to check if you do not hear from us after three days. °10. OTHER INSTRUCTIONS: _______________________________________________________________________________________________ _____________________________________________________________________________________________________________________________________ °_____________________________________________________________________________________________________________________________________ °_____________________________________________________________________________________________________________________________________ ° °WHEN TO CALL YOUR DOCTOR: °1. Fever over 101.0 °2. Nausea and/or vomiting. °3. Extreme swelling or bruising. °4. Continued bleeding from incision. °5. Increased pain, redness, or drainage from the incision. ° °The clinic staff is available to answer your questions during regular business hours.  Please don’t hesitate to call and ask to speak to one of the nurses for clinical concerns.  If you have a medical emergency, go to the nearest  emergency room or call 911.  A surgeon from [facility] is always on call at the hospital. ° °For further questions, please visit [email or web address]  ° ° °  Post Anesthesia Home Care Instructions ° °Activity: °Get plenty of rest for the remainder of the day. A responsible adult should stay with you for 24 hours following the procedure.  °For the next 24 hours, DO NOT: °-Drive a car °-Operate machinery °-Drink alcoholic beverages °-Take any medication unless instructed by your physician °-Make any legal decisions or sign important papers. ° °Meals: °Start with liquid foods such as gelatin or soup. Progress to regular foods as tolerated. Avoid greasy, spicy, heavy foods. If nausea and/or vomiting occur, drink only clear liquids until the nausea and/or vomiting subsides. Call your physician if vomiting continues. ° °Special Instructions/Symptoms: °Your throat may feel dry or sore from the anesthesia or the breathing tube placed in your throat during surgery. If this causes discomfort, gargle with warm salt water. The discomfort should disappear within 24 hours. ° °

## 2014-08-04 NOTE — H&P (View-Only) (Signed)
Patient ID: Paula Parks, female   DOB: [birth year], 58 y.o.   MRN: [id number]  No chief complaint on file.   HPI Paula Parks is a 58 y.o. female.  Pt sent at the request of Anda Kraft, MD for right breast microcalcifications found on mammogram.  Bx done and showed DCIS high grade 8 mm upper outer quadrant.  Denies breast pain,  Mass or discharge.   HPI  Past Medical History  Diagnosis Date  . Graves' disease     Past Surgical History  Procedure Laterality Date  . Tubal ligation    . Fibrocystic breast Left     Family History  Problem Relation Age of Onset  . Breast cancer Maternal Aunt   . Liver cancer Maternal Uncle     Social History History  Substance Use Topics  . Smoking status: Never Smoker   . Smokeless tobacco: Not on file  . Alcohol Use: Yes     Comment: 1-2/week    No Known Allergies  No current outpatient prescriptions on file.   No current facility-administered medications for this visit.    Review of Systems Review of Systems  Constitutional: Negative for fever, chills and unexpected weight change.  HENT: Negative for congestion, hearing loss, sore throat, trouble swallowing and voice change.   Eyes: Negative for visual disturbance.  Respiratory: Negative for cough and wheezing.   Cardiovascular: Negative for chest pain, palpitations and leg swelling.  Gastrointestinal: Negative for nausea, vomiting, abdominal pain, diarrhea, constipation, blood in stool, abdominal distention and anal bleeding.  Genitourinary: Negative for hematuria, vaginal bleeding and difficulty urinating.  Musculoskeletal: Negative for arthralgias.  Skin: Negative for rash and wound.  Neurological: Negative for seizures, syncope and headaches.  Hematological: Negative for adenopathy. Does not bruise/bleed easily.  Psychiatric/Behavioral: Negative for confusion.    There were no vitals taken for this visit.  Physical Exam Physical Exam  Constitutional: She is  oriented to person, place, and time. She appears well-developed and well-nourished.  HENT:  Head: Normocephalic and atraumatic.  Eyes: Pupils are equal, round, and reactive to light. No scleral icterus.  Neck: Normal range of motion. Neck supple.  Cardiovascular: Normal rate and regular rhythm.   Pulmonary/Chest: Effort normal. Right breast exhibits no inverted nipple, no mass, no nipple discharge, no skin change and no tenderness. Left breast exhibits no inverted nipple, no mass, no nipple discharge, no skin change and no tenderness. Breasts are symmetrical.  Musculoskeletal: Normal range of motion.  Neurological: She is alert and oriented to person, place, and time.  Skin: Skin is warm and dry.  Psychiatric: She has a normal mood and affect. Her behavior is normal. Judgment and thought content normal.    Data Reviewed CLINICAL DATA: Call back from screening for right breast  calcifications.  EXAM:  DIGITAL DIAGNOSTIC RIGHT MAMMOGRAM  COMPARISON: Prior exams  ACR Breast Density Category b: There are scattered areas of  fibroglandular density.  FINDINGS:  A small cluster of calcifications on the right have increased from 2  with 3 faint calcifications in 2014 and no perceptible  calcifications the year before that. They appear generally punctate  but very in size. There is no branching or linearity. There is no  associated mass.  IMPRESSION:  Increasing calcifications arranged in a small cluster in the lateral  right breast. Suspicious. Biopsy is recommended.  RECOMMENDATION:  Stereotactic core needle biopsy of the right breast calcifications.  Stereotactic biopsy will be scheduled.  I have discussed the findings and  recommendations with the patient.  Results were also provided in writing at the conclusion of the  visit. If applicable, a reminder letter will be sent to the patient  regarding the next appointment.  BI-RADS CATEGORY 4: Suspicious.  Electronically Signed  By:  Lajean Manes M.D.  On: 06/10/2014 09:01    Assessment    Right breast DCIS    Plan    Discussed lumpectomy vs mastectomy.  Pt would like breast conservation.  Discussed seed localization.The procedure has been discussed with the patient. Alternatives to surgery have been discussed with the patient.  Risks of surgery include bleeding,  Infection,  Seroma formation, death,  and the need for further surgery.   The patient understands and wishes to proceed.       Eleshia Wooley A. 07/07/2014, 3:17 PM

## 2014-08-05 ENCOUNTER — Encounter (HOSPITAL_BASED_OUTPATIENT_CLINIC_OR_DEPARTMENT_OTHER): Payer: Self-pay | Admitting: Surgery

## 2014-08-11 ENCOUNTER — Telehealth: Payer: Self-pay | Admitting: Hematology and Oncology

## 2014-08-11 NOTE — Telephone Encounter (Signed)
S/w pt to advise appt time chg on 9/30 from 9am to 10.45 due to md schedule chg. Pt is already aware due to mychart.

## 2014-08-18 ENCOUNTER — Ambulatory Visit: Payer: Federal, State, Local not specified - PPO

## 2014-08-18 ENCOUNTER — Telehealth: Payer: Self-pay | Admitting: Hematology and Oncology

## 2014-08-18 ENCOUNTER — Ambulatory Visit
Admission: RE | Admit: 2014-08-18 | Discharge: 2014-08-18 | Disposition: A | Discharge status: Home / Self Care | Payer: Federal, State, Local not specified - PPO | Source: Ambulatory Visit | Attending: Radiation Oncology | Admitting: Radiation Oncology

## 2014-08-18 ENCOUNTER — Ambulatory Visit (HOSPITAL_BASED_OUTPATIENT_CLINIC_OR_DEPARTMENT_OTHER): Payer: Federal, State, Local not specified - PPO | Admitting: Hematology and Oncology

## 2014-08-18 VITALS — BP 117/60 | HR 86 | Temp 98.2°F | Resp 18 | Ht 67.0 in | Wt 234.1 lb

## 2014-08-18 DIAGNOSIS — C50411 Malignant neoplasm of upper-outer quadrant of right female breast: Secondary | ICD-10-CM

## 2014-08-18 DIAGNOSIS — Z170 Estrogen receptor positive status [ER+]: Secondary | ICD-10-CM

## 2014-08-18 DIAGNOSIS — C50419 Malignant neoplasm of upper-outer quadrant of unspecified female breast: Secondary | ICD-10-CM

## 2014-08-18 MED ORDER — TAMOXIFEN CITRATE 10 MG PO TABS: 10.0000 mg | ORAL_TABLET | Freq: Two times a day (BID) | ORAL | Status: DC

## 2014-08-18 NOTE — Addendum Note (Signed)
Addended by: Prentiss Bells on: 08/18/2014 04:34 PM   Modules accepted: Orders

## 2014-08-18 NOTE — Assessment & Plan Note (Signed)
Right breast DCIS status post lumpectomy on 08/04/2014 final pathology came back as no evidence of DCIS in the lumpectomy specimen. Patient is here today to discuss the role of adjuvant antiestrogen therapy.  Patient had been taking about radiation therapy and she plans to not undergo any radiation. I discussed with her briefly about the benefits of radiation therapy after lumpectomy in decreasing the likelihood of recurrent DCIS or invasive breast cancer but the patient has made up her mind regarding this.  Tamoxifen counseling: We discussed the risks and benefits of tamoxifen. These include but not limited to insomnia, hot flashes, mood changes, vaginal dryness, and weight gain. Although rare, serious side effects including endometrial cancer, risk of blood clots were also discussed. We strongly believe that the benefits far outweigh the risks. Patient understands these risks and consented to starting treatment. Planned treatment duration is 5 years. Patient wanted to take the 10 mg dosage and if she tolerates it she will increase it to 20 mg.  Return to clinic in 3 months for clinic followup and to evaluate toxicities of tamoxifen

## 2014-08-18 NOTE — Telephone Encounter (Signed)
per pof to sch pt appt-gave pt copy of sch-pt req to CX read appt-stated decided not to take radon. Tlkwd w/Karen to CX appt

## 2014-08-18 NOTE — Progress Notes (Signed)
Patient Care Team: Anda Kraft, MD as PCP - General (Endocrinology) Rulon Eisenmenger, MD as Consulting Physician (Hematology and Oncology) Erroll Luna, MD as Consulting Physician (General Surgery) Thea Silversmith, MD as Consulting Physician (Radiation Oncology) Margarette Asal, MD as Consulting Physician (Obstetrics and Gynecology)  DIAGNOSIS: Breast cancer of upper-outer quadrant of right female breast   Primary site: Breast (Right)   Staging method: AJCC 7th Edition   Clinical: Stage 0 (Tis (DCIS), N0, cM0) signed by Rulon Eisenmenger, MD on 07/07/2014  8:45 AM   Summary: Stage 0 (Tis (DCIS), N0, cM0)   SUMMARY OF ONCOLOGIC HISTORY:   Breast cancer of upper-outer quadrant of right female breast   06/10/2014 Breast US Right breast upper quadrant: Increasing calcifications arranged in a small cluster in the lateral right breast. Suspicious.    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 Pathology revealed that she did not have any further evidence of DCIS in the final lumpectomy specimen. Based on the original biopsy, the tumor size was 5 mm with comedonecrosis ER/PR 100% positive. She is here today with her daughter to discuss whether or not she needs to take antiestrogen therapy and whether or not radiation therapy can be avoided. She is slightly sore from the recent surgery but otherwise feeling well and has recovered from it.  REVIEW OF SYSTEMS:   Constitutional: Denies  fevers, chills or abnormal weight loss Eyes: Denies blurriness of vision Ears, nose, mouth, throat, and face: Denies mucositis or sore throat Respiratory: Denies cough, dyspnea or wheezes Cardiovascular: Denies palpitation, chest discomfort or lower extremity swelling Gastrointestinal:  Denies nausea, heartburn or change in bowel habits Skin: Denies abnormal skin rashes Lymphatics: Denies new lymphadenopathy or easy bruising Neurological:Denies numbness, tingling or new weaknesses Behavioral/Psych: Mood is stable, no new changes  Breast:  Soreness in the right breast All other systems were reviewed with the patient and are negative.  I have reviewed the past medical history, past surgical history, social history and family history with the patient and they are unchanged from previous note.  ALLERGIES:  has No Known Allergies.  MEDICATIONS:  Current Outpatient Prescriptions  Medication Sig Dispense Refill  . Ascorbic Acid (VITAMIN C) 1000 MG tablet Take 1,000 mg by mouth daily.      Marland Kitchen levothyroxine (SYNTHROID, LEVOTHROID) 137 MCG tablet Take 137 mcg by mouth daily before breakfast.      . Misc Natural Products (OSTEO BI-FLEX ADV DOUBLE ST) CAPS Take by mouth.      . Multiple Vitamins-Minerals (MULTIVITAMIN WITH MINERALS) tablet Take 1 tablet by mouth daily.      . traMADol (ULTRAM) 50 MG tablet Take by mouth every 6 (six) hours as needed.      . Vitamin D, Ergocalciferol, (DRISDOL) 50000 UNITS CAPS capsule Take 10,000 Units by mouth every 7 (seven) days.      . tamoxifen (NOLVADEX) 10 MG tablet Take 1 tablet (10 mg total) by mouth 2 (two) times daily.  30 tablet  0   No current facility-administered medications for  this visit.    PHYSICAL EXAMINATION: ECOG PERFORMANCE STATUS: 1 - Symptomatic but completely ambulatory  Filed Vitals:   08/18/14 1100  BP: 117/60  Pulse: 86  Temp: 98.2 F (36.8 C)  Resp: 18   Filed Weights   08/18/14 1100  Weight: 234 lb 1.6 oz (106.187 kg)     GENERAL:alert, no distress and comfortable SKIN: skin color, texture, turgor are normal, no rashes or significant lesions EYES: normal, Conjunctiva are pink and non-injected, sclera clear OROPHARYNX:no exudate, no erythema and lips, buccal mucosa, and tongue normal  NECK: supple, thyroid normal size, non-tender, without nodularity LYMPH:  no palpable lymphadenopathy in the cervical, axillary or inguinal LUNGS: clear to auscultation and percussion with normal breathing effort HEART: regular rate & rhythm and no murmurs and no lower extremity edema ABDOMEN:abdomen soft, non-tender and normal bowel sounds Musculoskeletal:no cyanosis of digits and no clubbing  NEURO: alert & oriented x 3 with fluent speech, no focal motor/sensory deficits  LABORATORY DATA:  I have reviewed the data as listed   Chemistry      Component Value Date/Time   NA 141 08/02/2014 1600   K 4.3 08/02/2014 1600   CL 104 08/02/2014 1600   CO2 25 08/02/2014 1600   BUN 14 08/02/2014 1600   CREATININE 0.73 08/02/2014 1600      Component Value Date/Time   CALCIUM 8.9 08/02/2014 1600   ALKPHOS 70 08/02/2014 1600   AST 18 08/02/2014 1600   ALT 20 08/02/2014 1600   BILITOT 0.7 08/02/2014 1600       Lab Results  Component Value Date   WBC 6.1 08/02/2014   HGB 14.4 08/02/2014   HCT 42.3 08/02/2014   MCV 85.5 08/02/2014   PLT 282 08/02/2014   NEUTROABS 3.3 08/02/2014     RADIOGRAPHIC STUDIES: I have personally reviewed the radiology reports and agreed with their findings. No results found.   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 Patient is here today to discuss the role of adjuvant antiestrogen therapy.  Patient had been taking about radiation therapy and she plans to not undergo any radiation. I discussed with her briefly about the benefits of  radiation therapy after lumpectomy in decreasing the likelihood of recurrent DCIS or invasive breast cancer but the patient has made up her mind regarding this.  Tamoxifen counseling: We discussed the risks and benefits of tamoxifen. These include but not limited to insomnia, hot flashes, mood changes, vaginal dryness, and weight gain. Although rare, serious side effects including endometrial cancer, risk of blood clots were also discussed. We strongly believe that the benefits far outweigh the risks. Patient understands these risks and consented to starting treatment. Planned treatment duration is 5 years. Patient wanted to take the 10 mg dosage and if she tolerates it she will increase it to 20 mg.  Return to clinic in 3 months for clinic followup and to evaluate toxicities of tamoxifen     No orders of the defined types were placed in this encounter.   The patient has a good understanding of the overall plan. she agrees with it. She will call with any problems that may develop before her next visit here.  I spent 20 minutes counseling the patient face to face. The total time spent in the appointment was 25 minutes and more than 50% was on counseling  and review of test results    Rulon Eisenmenger, MD 08/18/2014 11:46 AM

## 2014-08-30 ENCOUNTER — Telehealth: Payer: Self-pay

## 2014-08-31 NOTE — Telephone Encounter (Signed)
charting error

## 2014-09-21 ENCOUNTER — Telehealth: Payer: Self-pay

## 2014-09-21 NOTE — Telephone Encounter (Signed)
Returned pt call.  Pt reports hot flashes, cramping a week ago that has since resolved, and some discharge.  Let pt know that these symptoms are normal side effects of tamoxifen.  Pt questioned if she needed to go to gyn - is very concerned about the risk of endometrial cancer.  Let pt know she should see her gyn annually.  Reassured pt that MD evaluates risk vs benefit when prescribing and that she can discuss further with Dr Lindi Adie when she comes in for 3 month follow up.  Patient does not want to increase dose to 20 mg yet due to side effects she is experiencing today.  Let pt know that it will take her body a few months to adjust to the medicine and that it may get better by the time she comes in for her followup.  Let pt know I would let Dr Lindi Adie know.   Progress note provided to Dr Lindi Adie.

## 2014-09-28 ENCOUNTER — Other Ambulatory Visit: Payer: Self-pay | Admitting: Hematology and Oncology

## 2014-10-11 ENCOUNTER — Other Ambulatory Visit: Payer: Self-pay

## 2014-10-11 DIAGNOSIS — C50411 Malignant neoplasm of upper-outer quadrant of right female breast: Secondary | ICD-10-CM

## 2014-10-11 MED ORDER — TAMOXIFEN CITRATE 10 MG PO TABS: 10.0000 mg | ORAL_TABLET | Freq: Two times a day (BID) | ORAL | Status: DC

## 2014-10-12 ENCOUNTER — Other Ambulatory Visit: Payer: Self-pay | Admitting: Hematology and Oncology

## 2014-10-26 ENCOUNTER — Other Ambulatory Visit: Payer: Self-pay | Admitting: *Deleted

## 2014-10-26 DIAGNOSIS — C50411 Malignant neoplasm of upper-outer quadrant of right female breast: Secondary | ICD-10-CM

## 2014-10-26 MED ORDER — TAMOXIFEN CITRATE 10 MG PO TABS: 10.0000 mg | ORAL_TABLET | Freq: Two times a day (BID) | ORAL | Status: DC

## 2014-11-16 ENCOUNTER — Telehealth: Payer: Self-pay | Admitting: Hematology and Oncology

## 2014-11-16 ENCOUNTER — Ambulatory Visit (HOSPITAL_BASED_OUTPATIENT_CLINIC_OR_DEPARTMENT_OTHER): Payer: Federal, State, Local not specified - PPO | Admitting: Hematology and Oncology

## 2014-11-16 VITALS — BP 147/65 | HR 69 | Temp 97.5°F | Resp 20 | Ht 67.0 in | Wt 236.0 lb

## 2014-11-16 DIAGNOSIS — C50411 Malignant neoplasm of upper-outer quadrant of right female breast: Secondary | ICD-10-CM

## 2014-11-16 DIAGNOSIS — D0511 Intraductal carcinoma in situ of right breast: Secondary | ICD-10-CM

## 2014-11-16 DIAGNOSIS — N951 Menopausal and female climacteric states: Secondary | ICD-10-CM

## 2014-11-16 MED ORDER — TAMOXIFEN CITRATE 10 MG PO TABS: 10.0000 mg | ORAL_TABLET | Freq: Every day | ORAL | Status: DC

## 2014-11-16 NOTE — Telephone Encounter (Signed)
per pof to sch pt appt-gave pt copy of sch °

## 2014-11-16 NOTE — Progress Notes (Signed)
Patient Care Team: Anda Kraft, MD as PCP - General (Endocrinology) Rulon Eisenmenger, MD as Consulting Physician (Hematology and Oncology) Erroll Luna, MD as Consulting Physician (General Surgery) Thea Silversmith, MD as Consulting Physician (Radiation Oncology) Margarette Asal, MD as Consulting Physician (Obstetrics and Gynecology)  DIAGNOSIS: Breast cancer of upper-outer quadrant of right female breast   Staging form: Breast, AJCC 7th Edition     Clinical: Stage 0 (Tis (DCIS), N0, cM0) - Signed by Rulon Eisenmenger, MD on 07/07/2014     Pathologic: No stage assigned - Unsigned   SUMMARY OF ONCOLOGIC HISTORY:   Breast cancer of upper-outer quadrant of right female breast   06/10/2014 Breast US Right breast upper quadrant: Increasing calcifications arranged in a small cluster in the lateral right breast. Suspicious.    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 She did not want undergo radiation therapy. She reports no major problems other than breast tenderness. She does have hot flashes related to tamoxifen therapy. Occasional spotting but no bleeding. It is not persistent.  REVIEW OF SYSTEMS:   Constitutional: Denies fevers, chills or abnormal weight loss Eyes: Denies blurriness of vision Ears, nose, mouth,  throat, and face: Denies mucositis or sore throat Respiratory: Denies cough, dyspnea or wheezes Cardiovascular: Denies palpitation, chest discomfort or lower extremity swelling Gastrointestinal:  Denies nausea, heartburn or change in bowel habits Skin: Denies abnormal skin rashes Lymphatics: Denies new lymphadenopathy or easy bruising Neurological:Denies numbness, tingling or new weaknesses Behavioral/Psych: Mood is stable, no new changes  Breast: Breast tenderness All other systems were reviewed with the patient and are negative.  I have reviewed the past medical history, past surgical history, social history and family history with the patient and they are unchanged from previous note.  ALLERGIES:  has No Known Allergies.  MEDICATIONS:  Current Outpatient Prescriptions  Medication Sig Dispense Refill  . Ascorbic Acid (VITAMIN C) 1000 MG tablet Take 1,000 mg by mouth daily.    Marland Kitchen levothyroxine (SYNTHROID, LEVOTHROID) 137 MCG tablet Take 137 mcg by mouth daily before breakfast.    . Misc Natural Products (OSTEO BI-FLEX ADV DOUBLE ST) CAPS Take by mouth.    . Multiple Vitamins-Minerals (MULTIVITAMIN WITH MINERALS) tablet Take 1 tablet by mouth daily.    . tamoxifen (NOLVADEX) 10 MG tablet Take 1 tablet (10 mg total) by mouth daily. 90 tablet 3  . traMADol (ULTRAM) 50 MG tablet Take by mouth every 6 (six) hours as needed.    . Vitamin D, Ergocalciferol, (DRISDOL) 50000 UNITS CAPS capsule Take 10,000 Units by mouth every 7 (seven) days.     No current facility-administered medications for this visit.    PHYSICAL EXAMINATION: ECOG PERFORMANCE STATUS: 1 - Symptomatic but completely ambulatory  Filed Vitals:   11/16/14 1340  BP: 147/65  Pulse: 69  Temp: 97.5 F (36.4 C)  Resp: 20  Filed Weights   11/16/14 1340  Weight: 236 lb (107.049 kg)    GENERAL:alert, no distress and comfortable SKIN: skin color, texture, turgor are normal, no rashes or significant lesions EYES: normal,  Conjunctiva are pink and non-injected, sclera clear OROPHARYNX:no exudate, no erythema and lips, buccal mucosa, and tongue normal  NECK: supple, thyroid normal size, non-tender, without nodularity LYMPH:  no palpable lymphadenopathy in the cervical, axillary or inguinal LUNGS: clear to auscultation and percussion with normal breathing effort HEART: regular rate & rhythm and no murmurs and no lower extremity edema ABDOMEN:abdomen soft, non-tender and normal bowel sounds Musculoskeletal:no cyanosis of digits and no clubbing  NEURO: alert & oriented x 3 with fluent speech, no focal motor/sensory deficits  LABORATORY DATA:  I have reviewed the data as listed   Chemistry      Component Value Date/Time   NA 141 08/02/2014 1600   K 4.3 08/02/2014 1600   CL 104 08/02/2014 1600   CO2 25 08/02/2014 1600   BUN 14 08/02/2014 1600   CREATININE 0.73 08/02/2014 1600      Component Value Date/Time   CALCIUM 8.9 08/02/2014 1600   ALKPHOS 70 08/02/2014 1600   AST 18 08/02/2014 1600   ALT 20 08/02/2014 1600   BILITOT 0.7 08/02/2014 1600       Lab Results  Component Value Date   WBC 6.1 08/02/2014   HGB 14.4 08/02/2014   HCT 42.3 08/02/2014   MCV 85.5 08/02/2014   PLT 282 08/02/2014   NEUTROABS 3.3 08/02/2014   ASSESSMENT & PLAN:  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 Patient refuses radiation therapy and started on tamoxifen 10 mg daily 08/18/2014 with the plan to increase it to 20 mg if she tolerates it well  Tamoxifen toxicities: 1. Breast tenderness 2. Occasional spotting 3. Hot flashes Our plan is to see her back in 3 months and if she tolerates tamoxifen better, we will increase the dosage of 20 mg daily. I provided her with a prescription of 10 mg dosage tamoxifen with refills.  Breast cancer surveillance: Annual mammograms  and every 6 month breast exams.  Return to clinic in 3 months for follow-up.     No orders of the defined types were placed in this encounter.   The patient has a good understanding of the overall plan. she agrees with it. She will call with any problems that may develop before her next visit here.   Rulon Eisenmenger, MD 11/16/2014 1:50 PM

## 2014-11-16 NOTE — Addendum Note (Signed)
Addended by: Prentiss Bells on: 11/16/2014 02:32 PM   Modules accepted: Medications

## 2014-11-16 NOTE — Assessment & Plan Note (Addendum)
Right breast DCIS status post lumpectomy on 08/04/2014 final pathology came back as no evidence of DCIS in the lumpectomy specimen. Patient refuses radiation therapy and started on tamoxifen 10 mg daily 08/18/2014 with the plan to increase it to 20 mg if she tolerates it well  Tamoxifen toxicities: 1. Breast tenderness 2. Occasional spotting 3. Hot flashes Our plan is to see her back in 3 months and if she tolerates tamoxifen better, we will increase the dosage of 20 mg daily. I provided her with a prescription of 10 mg dosage tamoxifen with refills.  Breast cancer surveillance: Annual mammograms and every 6 month breast exams.  Return to clinic in 3 months for follow-up.

## 2014-11-21 ENCOUNTER — Other Ambulatory Visit: Payer: Self-pay | Admitting: Hematology and Oncology

## 2015-02-21 ENCOUNTER — Telehealth: Payer: Self-pay | Admitting: Hematology and Oncology

## 2015-02-21 ENCOUNTER — Ambulatory Visit (HOSPITAL_BASED_OUTPATIENT_CLINIC_OR_DEPARTMENT_OTHER): Payer: Federal, State, Local not specified - PPO | Admitting: Hematology and Oncology

## 2015-02-21 VITALS — BP 157/75 | HR 88 | Temp 97.7°F | Resp 18 | Ht 67.0 in | Wt 235.1 lb

## 2015-02-21 DIAGNOSIS — D0511 Intraductal carcinoma in situ of right breast: Secondary | ICD-10-CM

## 2015-02-21 DIAGNOSIS — N951 Menopausal and female climacteric states: Secondary | ICD-10-CM | POA: Diagnosis not present

## 2015-02-21 DIAGNOSIS — Z170 Estrogen receptor positive status [ER+]: Secondary | ICD-10-CM

## 2015-02-21 DIAGNOSIS — C50411 Malignant neoplasm of upper-outer quadrant of right female breast: Secondary | ICD-10-CM

## 2015-02-21 NOTE — Progress Notes (Signed)
Patient Care Team: Anda Kraft, MD as PCP - General (Endocrinology) Nicholas Lose, MD as Consulting Physician (Hematology and Oncology) Erroll Luna, MD as Consulting Physician (General Surgery) Thea Silversmith, MD as Consulting Physician (Radiation Oncology) Molli Posey, MD as Consulting Physician (Obstetrics and Gynecology)  DIAGNOSIS: Breast cancer of upper-outer quadrant of right female breast   Staging form: Breast, AJCC 7th Edition     Clinical: Stage 0 (Tis (DCIS), N0, cM0) - Signed by Rulon Eisenmenger, MD on 07/07/2014     Pathologic: No stage assigned - Unsigned   SUMMARY OF ONCOLOGIC HISTORY:   Breast cancer of upper-outer quadrant of right female breast   06/10/2014 Breast US Right breast upper quadrant: Increasing calcifications arranged in a small cluster in the lateral right breast. Suspicious.    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 She appears to be tolerating tamoxifen at this dosage fairly well except for mild hot flashes or myalgias. She denies any new lumps or nodules in the breasts.  REVIEW OF SYSTEMS:   Constitutional: Denies fevers, chills or abnormal weight loss Eyes: Denies blurriness of  vision Ears, nose, mouth, throat, and face: Denies mucositis or sore throat Respiratory: Denies cough, dyspnea or wheezes Cardiovascular: Denies palpitation, chest discomfort or lower extremity swelling Gastrointestinal:  Denies nausea, heartburn or change in bowel habits Skin: Denies abnormal skin rashes Lymphatics: Denies new lymphadenopathy or easy bruising Neurological:Denies numbness, tingling or new weaknesses Behavioral/Psych: Mood is stable, no new changes  Breast:  denies any pain or lumps or nodules in either breasts All other systems were reviewed with the patient and are negative.  I have reviewed the past medical history, past surgical history, social history and family history with the patient and they are unchanged from previous note.  ALLERGIES:  has No Known Allergies.  MEDICATIONS:  Current Outpatient Prescriptions  Medication Sig Dispense Refill  . Ascorbic Acid (VITAMIN C) 1000 MG tablet Take 1,000 mg by mouth daily.    Marland Kitchen levothyroxine (SYNTHROID, LEVOTHROID) 137 MCG tablet Take 137 mcg by mouth daily before breakfast.    . Misc Natural Products (OSTEO BI-FLEX ADV DOUBLE ST) CAPS Take by mouth.    . Multiple Vitamins-Minerals (MULTIVITAMIN WITH MINERALS) tablet Take 1 tablet by mouth daily.    . tamoxifen (NOLVADEX) 10 MG tablet Take 1 tablet (10 mg total) by mouth daily. 90 tablet 3  . tamoxifen (NOLVADEX) 10 MG tablet TAKE 1 TABLET (10 MG TOTAL) BY MOUTH 2 (TWO) TIMES DAILY. 90 tablet 0  . traMADol (ULTRAM) 50 MG tablet Take by mouth every 6 (six) hours as needed.    . Vitamin D, Ergocalciferol, (DRISDOL) 50000 UNITS CAPS capsule Take 10,000 Units by mouth every 7 (seven) days.     No current facility-administered medications for this  visit.    PHYSICAL EXAMINATION: ECOG PERFORMANCE STATUS: 0 - Asymptomatic  There were no vitals filed for this visit. There were no vitals filed for this visit.  GENERAL:alert, no distress and comfortable SKIN: skin color,  texture, turgor are normal, no rashes or significant lesions EYES: normal, Conjunctiva are pink and non-injected, sclera clear OROPHARYNX:no exudate, no erythema and lips, buccal mucosa, and tongue normal  NECK: supple, thyroid normal size, non-tender, without nodularity LYMPH:  no palpable lymphadenopathy in the cervical, axillary or inguinal LUNGS: clear to auscultation and percussion with normal breathing effort HEART: regular rate & rhythm and no murmurs and no lower extremity edema ABDOMEN:abdomen soft, non-tender and normal bowel sounds Musculoskeletal:no cyanosis of digits and no clubbing  NEURO: alert & oriented x 3 with fluent speech, no focal motor/sensory deficits BREAST: No palpable masses or nodules in either right or left breasts. No palpable axillary supraclavicular or infraclavicular adenopathy no breast tenderness or nipple discharge. (exam performed in the presence of a chaperone)  LABORATORY DATA:  I have reviewed the data as listed   Chemistry      Component Value Date/Time   NA 141 08/02/2014 1600   K 4.3 08/02/2014 1600   CL 104 08/02/2014 1600   CO2 25 08/02/2014 1600   BUN 14 08/02/2014 1600   CREATININE 0.73 08/02/2014 1600      Component Value Date/Time   CALCIUM 8.9 08/02/2014 1600   ALKPHOS 70 08/02/2014 1600   AST 18 08/02/2014 1600   ALT 20 08/02/2014 1600   BILITOT 0.7 08/02/2014 1600       Lab Results  Component Value Date   WBC 6.1 08/02/2014   HGB 14.4 08/02/2014   HCT 42.3 08/02/2014   MCV 85.5 08/02/2014   PLT 282 08/02/2014   NEUTROABS 3.3 08/02/2014    ASSESSMENT & PLAN:  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 Patient refused radiation therapy and started on tamoxifen 10 mg daily 08/18/2014 because she is concerned about toxicities  Tamoxifen toxicities: 1. Breast tenderness 2.  Occasional spotting 3. Hot flashes: her body seems to be adjusting to it. We will increase the dosage of tamoxifen to 20 mg daily. She will take two 10 mg tablets. When she runs out of this prescription, we will call in for 20 mg dosage of tamoxifen once daily.  Breast cancer surveillance:  1. Annual mammograms In July 2016 and  2. every 6 month breast exams. Breast exam 02/21/2015 is normal  Return to clinic in 6 months for follow-up.  No orders of the defined types were placed in this encounter.   The patient has a good understanding of the overall plan. she agrees with it. She will call with any problems that may develop before her next visit here.   Rulon Eisenmenger, MD

## 2015-02-21 NOTE — Telephone Encounter (Signed)
per pof to sch pt appt-gave pt copy of sch °

## 2015-02-21 NOTE — Assessment & Plan Note (Signed)
Right breast DCIS status post lumpectomy on 08/04/2014 final pathology came back as no evidence of DCIS in the lumpectomy specimen. Patient refused radiation therapy and started on tamoxifen 10 mg daily 08/18/2014 because she is concerned about toxicities  Tamoxifen toxicities: 1. Breast tenderness 2. Occasional spotting 3. Hot flashes Our plan is to see her back in 3 months and if she tolerates tamoxifen better, we will increase the dosage of 20 mg daily. I provided her with a prescription of 10 mg dosage tamoxifen with refills.  Breast cancer surveillance: Annual mammograms and every 6 month breast exams.  Return to clinic in 6 months for follow-up.

## 2015-05-09 ENCOUNTER — Other Ambulatory Visit: Payer: Self-pay | Admitting: Endocrinology

## 2015-05-09 DIAGNOSIS — Z9889 Other specified postprocedural states: Secondary | ICD-10-CM

## 2015-05-09 DIAGNOSIS — Z853 Personal history of malignant neoplasm of breast: Secondary | ICD-10-CM

## 2015-05-16 ENCOUNTER — Other Ambulatory Visit: Payer: Self-pay | Admitting: *Deleted

## 2015-05-16 DIAGNOSIS — C50411 Malignant neoplasm of upper-outer quadrant of right female breast: Secondary | ICD-10-CM

## 2015-05-16 MED ORDER — TAMOXIFEN CITRATE 20 MG PO TABS: 20.0000 mg | ORAL_TABLET | Freq: Every day | ORAL | Status: DC

## 2015-05-30 ENCOUNTER — Other Ambulatory Visit: Payer: Self-pay | Admitting: Obstetrics and Gynecology

## 2015-05-31 LAB — CYTOLOGY - PAP

## 2015-06-06 ENCOUNTER — Ambulatory Visit
Admission: RE | Admit: 2015-06-06 | Discharge: 2015-06-06 | Disposition: A | Discharge status: Home / Self Care | Payer: Federal, State, Local not specified - PPO | Source: Ambulatory Visit | Attending: Endocrinology | Admitting: Endocrinology

## 2015-06-06 DIAGNOSIS — Z853 Personal history of malignant neoplasm of breast: Secondary | ICD-10-CM

## 2015-06-06 DIAGNOSIS — Z9889 Other specified postprocedural states: Secondary | ICD-10-CM

## 2015-06-07 ENCOUNTER — Encounter: Payer: Self-pay | Admitting: Hematology and Oncology

## 2015-06-13 ENCOUNTER — Telehealth: Payer: Self-pay

## 2015-06-13 NOTE — Telephone Encounter (Signed)
Pt reports annual exam with Dr. Matthew Saras app 2 weeks ago found cervical polyps.   She has an appt on 7/28 for ultrasound and further workup.  Advised pt to hold tamoxifen tomorrow morning.  Let her know I would discuss with Dr. Lindi Adie and call her back in am.  Pt voiced understanding.

## 2015-06-14 ENCOUNTER — Telehealth: Payer: Self-pay

## 2015-06-14 ENCOUNTER — Telehealth: Payer: Self-pay | Admitting: Hematology and Oncology

## 2015-06-14 NOTE — Telephone Encounter (Signed)
Let pt know I had discussed the cervical polyps with Dr. Lindi Adie.  He would like her to remain on the tamoxifen until Dr. Matthew Saras has completed his workup.  He will speak with her about the results and tamoxifen once they are available, either by phone or at an appt.  pof sent.

## 2015-06-14 NOTE — Telephone Encounter (Signed)
Called patient and she is aware of her  Follow up appointment

## 2015-06-28 ENCOUNTER — Telehealth: Payer: Self-pay

## 2015-06-28 NOTE — Telephone Encounter (Signed)
Ultrasound report dtd 06/16/15 rcvd from [facility].  Reviewed by Dr. Lindi Adie.  Sent to scan.

## 2015-07-12 ENCOUNTER — Ambulatory Visit (HOSPITAL_BASED_OUTPATIENT_CLINIC_OR_DEPARTMENT_OTHER): Payer: Federal, State, Local not specified - PPO | Admitting: Hematology and Oncology

## 2015-07-12 ENCOUNTER — Telehealth: Payer: Self-pay | Admitting: Hematology and Oncology

## 2015-07-12 ENCOUNTER — Encounter: Payer: Self-pay | Admitting: Hematology and Oncology

## 2015-07-12 VITALS — BP 141/81 | HR 95 | Temp 98.6°F | Resp 18 | Ht 67.0 in | Wt 240.3 lb

## 2015-07-12 DIAGNOSIS — N951 Menopausal and female climacteric states: Secondary | ICD-10-CM

## 2015-07-12 DIAGNOSIS — Z170 Estrogen receptor positive status [ER+]: Secondary | ICD-10-CM | POA: Diagnosis not present

## 2015-07-12 DIAGNOSIS — D0511 Intraductal carcinoma in situ of right breast: Secondary | ICD-10-CM

## 2015-07-12 DIAGNOSIS — C50411 Malignant neoplasm of upper-outer quadrant of right female breast: Secondary | ICD-10-CM

## 2015-07-12 MED ORDER — ANASTROZOLE 1 MG PO TABS: 1.0000 mg | ORAL_TABLET | Freq: Every day | ORAL | Status: AC

## 2015-07-12 NOTE — Telephone Encounter (Signed)
Appointments made and avs printed for patient °

## 2015-07-12 NOTE — Progress Notes (Signed)
Patient Care Team: Anda Kraft, MD as PCP - General (Endocrinology) Nicholas Lose, MD as Consulting Physician (Hematology and Oncology) Erroll Luna, MD as Consulting Physician (General Surgery) Thea Silversmith, MD as Consulting Physician (Radiation Oncology) Molli Posey, MD as Consulting Physician (Obstetrics and Gynecology)  DIAGNOSIS: Breast cancer of upper-outer quadrant of right female breast   Staging form: Breast, AJCC 7th Edition     Clinical: Stage 0 (Tis (DCIS), N0, cM0) - Signed by Rulon Eisenmenger, MD on 07/07/2014     Pathologic: No stage assigned - Unsigned   SUMMARY OF ONCOLOGIC HISTORY:   Breast cancer of upper-outer quadrant of right female breast   06/10/2014 Breast US Right breast upper quadrant: Increasing calcifications arranged in a small cluster in the lateral right breast. Suspicious.    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 She is scheduled to undergo D&C mid October by her gynecologist. She has stopped taking tamoxifen about 2 weeks ago. She had her last menstrual cycle 2 years ago. She currently has hot flashes.  REVIEW OF SYSTEMS:   Constitutional: Denies fevers, chills or abnormal weight loss Eyes: Denies blurriness  of vision Ears, nose, mouth, throat, and face: Denies mucositis or sore throat Respiratory: Denies cough, dyspnea or wheezes Cardiovascular: Denies palpitation, chest discomfort or lower extremity swelling Gastrointestinal:  Denies nausea, heartburn or change in bowel habits Skin: Denies abnormal skin rashes Lymphatics: Denies new lymphadenopathy or easy bruising Neurological:Denies numbness, tingling or new weaknesses Behavioral/Psych: Mood is stable, no new changes   All other systems were reviewed with the patient and are negative.  I have reviewed the past medical history, past surgical history, social history and family history with the patient and they are unchanged from previous note.  ALLERGIES:  has No Known Allergies.  MEDICATIONS:  Current Outpatient Prescriptions  Medication Sig Dispense Refill  . Ascorbic Acid (VITAMIN C) 1000 MG tablet Take 1,000 mg by mouth daily.    Marland Kitchen levothyroxine (SYNTHROID, LEVOTHROID) 137 MCG tablet Take 137 mcg by mouth daily before breakfast.    . Misc Natural Products (OSTEO BI-FLEX ADV DOUBLE ST) CAPS Take by mouth.    . Multiple Vitamins-Minerals (MULTIVITAMIN WITH MINERALS) tablet Take 1 tablet by mouth daily.    . tamoxifen (NOLVADEX) 20 MG tablet Take 1 tablet (20 mg total) by mouth daily. 90 tablet 0  . traMADol (ULTRAM) 50 MG tablet Take by mouth every 6 (six) hours as needed.    . Vitamin D, Ergocalciferol, (DRISDOL) 50000 UNITS CAPS capsule Take 10,000 Units by mouth every 7 (seven) days.     No current facility-administered medications for this visit.    PHYSICAL EXAMINATION: ECOG PERFORMANCE STATUS: 0 - Asymptomatic  Filed Vitals:   07/12/15 1358  BP: 141/81  Pulse: 95  Temp: 98.6 F (37  C)  Resp: 18   Filed Weights   07/12/15 1358  Weight: 240 lb 4.8 oz (108.999 kg)    GENERAL:alert, no distress and comfortable SKIN: skin color, texture, turgor are normal, no rashes or significant lesions EYES: normal, Conjunctiva are  pink and non-injected, sclera clear OROPHARYNX:no exudate, no erythema and lips, buccal mucosa, and tongue normal  NECK: supple, thyroid normal size, non-tender, without nodularity LYMPH:  no palpable lymphadenopathy in the cervical, axillary or inguinal LUNGS: clear to auscultation and percussion with normal breathing effort HEART: regular rate & rhythm and no murmurs and no lower extremity edema ABDOMEN:abdomen soft, non-tender and normal bowel sounds Musculoskeletal:no cyanosis of digits and no clubbing  NEURO: alert & oriented x 3 with fluent speech, no focal motor/sensory deficits  LABORATORY DATA:  I have reviewed the data as listed   Chemistry      Component Value Date/Time   NA 141 08/02/2014 1600   K 4.3 08/02/2014 1600   CL 104 08/02/2014 1600   CO2 25 08/02/2014 1600   BUN 14 08/02/2014 1600   CREATININE 0.73 08/02/2014 1600      Component Value Date/Time   CALCIUM 8.9 08/02/2014 1600   ALKPHOS 70 08/02/2014 1600   AST 18 08/02/2014 1600   ALT 20 08/02/2014 1600   BILITOT 0.7 08/02/2014 1600       Lab Results  Component Value Date   WBC 6.1 08/02/2014   HGB 14.4 08/02/2014   HCT 42.3 08/02/2014   MCV 85.5 08/02/2014   PLT 282 08/02/2014   NEUTROABS 3.3 08/02/2014   ASSESSMENT & PLAN:  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 Patient refused radiation therapy and started on tamoxifen 10 mg daily 08/18/2014 because she is concerned about toxicities  Tamoxifen toxicities: 1. Breast tenderness 2. Occasional spotting: Ultrasound performed by her GYN 3. Hot flashes We increased the dosage of tamoxifen to 20 mg daily 02/21/2015 but patient is continuing to experience uterine bleeding and spotting. Since she discontinued it 2 weeks ago.  Recommendation: Change therapy to anastrozole 1 mg daily. Patient has a  scheduled appointment for [facility] in October. I instructed her to start this mid-October after the [facility]. I would like to see her back one month after starting anastrozole.  Patient is hoping to not have vaginal dryness because she is planning to meet someone to get married to. I discussed with her that coconut oil deftly helps with vaginal dryness.  No orders of the defined types were placed in this encounter.   The patient has a good understanding of the overall plan. she agrees with it. she will call with any problems that may develop before the next visit here.   Rulon Eisenmenger, MD

## 2015-07-12 NOTE — Assessment & Plan Note (Signed)
Right breast DCIS status post lumpectomy on 08/04/2014 final pathology came back as no evidence of DCIS in the lumpectomy specimen. Patient refused radiation therapy and started on tamoxifen 10 mg daily 08/18/2014 because she is concerned about toxicities  Tamoxifen toxicities: 1. Breast tenderness 2. Occasional spotting: Ultrasound performed by her GYN 3. Hot flashes We increased the dosage of tamoxifen to 20 mg daily 02/21/2015

## 2015-09-06 ENCOUNTER — Ambulatory Visit: Payer: Self-pay | Admitting: Hematology and Oncology

## 2015-09-22 ENCOUNTER — Telehealth: Payer: Self-pay | Admitting: *Deleted

## 2015-09-22 NOTE — Telephone Encounter (Signed)
FYI Lottie Dawson called requesting collaborative nurse to discuss bill.  Advised we do not handle billing at [facility].  Provided Professional Billing number [phone].  Ms. Berline Lopes shared the following complaints.    "I have to dispute this because there was no reason for me to come in.  I was given a prescription for Anastrozole to start after surgery and I didn't have to be seen for a prescription I did not need to start until a month or so later than the visit.  I'd stopped the tamoxifen because I was bleeding.  Tamoxifen was the wrong medication because I'm post menopausal and do not need anything to block estrogen.  Had to have surgery because of the bleeding with fibroids and polyps." "I don't feel important but my insurance was billed and I was billed a 2 dollar co-pay.  I couldn't even get a refill on Tamoxifen.  My pharmacy said no one would call or respond to the faxed refill request.  How hard is it to get a fax?  I called and drove to my pharmacy who said the office won't call us back." "I called the office when I was having problems with Tamoxifen and never got return calls." This nurse advised she ask operators to page someone in the office whenever she has an emergency or an event she needs to reach a live person.   Next scheduled F/U 11-01-2015

## 2015-10-21 ENCOUNTER — Telehealth: Payer: Self-pay

## 2015-10-21 NOTE — Telephone Encounter (Signed)
Pt called disputing need for office visit - states could have been discussed over the phone, that she should never have been put on Tamoxifen and she does not want to pay the $40 co-pay.  I advised patient that I know very little about insurance and even less about billing and that I could not solve this problem for her.  I recommended she call billing which she states she has and "just get the run-around".  I offered to provide patient with contact information for clinic administration - she refused.  I let patient know that the $40 co-pay was established by her insurance company and that if she had came in for a visit she would have the co-pay.  I also let her know that the office visit was medically necessary; that the decision to put a patient on tamoxifen or an AI is not determined just by menopausal status, and that Dr. Lindi Adie needs to see patients that are having problems with their medication so he can assess the symptoms and side effects, their severity and impact on the patient and discuss with the patient the alternatives that can be used.  Pt requested medical records so she can file for Social Security Disability be sent to her home.  I let her know I would send request to HIM and they would mail to her.  Pt voiced understanding.

## 2015-10-31 ENCOUNTER — Telehealth: Payer: Self-pay | Admitting: Hematology and Oncology

## 2015-10-31 NOTE — Telephone Encounter (Signed)
Returned patients call as she wishes to cancel her appointment and will call back to reschedule at a later date  Paula Parks

## 2015-11-01 ENCOUNTER — Ambulatory Visit: Payer: Self-pay | Admitting: Hematology and Oncology

## 2016-06-06 ENCOUNTER — Other Ambulatory Visit: Payer: Self-pay | Admitting: Endocrinology

## 2016-06-06 DIAGNOSIS — Z853 Personal history of malignant neoplasm of breast: Secondary | ICD-10-CM

## 2016-06-14 ENCOUNTER — Inpatient Hospital Stay: Admission: RE | Admit: 2016-06-14 | Payer: Self-pay | Source: Ambulatory Visit

## 2016-07-30 ENCOUNTER — Ambulatory Visit
Admission: RE | Admit: 2016-07-30 | Discharge: 2016-07-30 | Disposition: A | Discharge status: Home / Self Care | Payer: Federal, State, Local not specified - PPO | Source: Ambulatory Visit | Attending: Endocrinology | Admitting: Endocrinology

## 2016-07-30 DIAGNOSIS — Z853 Personal history of malignant neoplasm of breast: Secondary | ICD-10-CM

## 2016-11-15 DIAGNOSIS — K080 Exfoliation of teeth due to systemic causes: Secondary | ICD-10-CM | POA: Diagnosis not present

## 2016-11-21 DIAGNOSIS — E079 Disorder of thyroid, unspecified: Secondary | ICD-10-CM | POA: Diagnosis not present

## 2017-03-19 DIAGNOSIS — K080 Exfoliation of teeth due to systemic causes: Secondary | ICD-10-CM | POA: Diagnosis not present

## 2017-05-09 DIAGNOSIS — R81 Glycosuria: Secondary | ICD-10-CM | POA: Diagnosis not present

## 2017-05-09 DIAGNOSIS — Z1321 Encounter for screening for nutritional disorder: Secondary | ICD-10-CM | POA: Diagnosis not present

## 2017-05-09 DIAGNOSIS — Z01419 Encounter for gynecological examination (general) (routine) without abnormal findings: Secondary | ICD-10-CM | POA: Diagnosis not present

## 2017-05-09 DIAGNOSIS — R319 Hematuria, unspecified: Secondary | ICD-10-CM | POA: Diagnosis not present

## 2017-05-09 DIAGNOSIS — R252 Cramp and spasm: Secondary | ICD-10-CM | POA: Diagnosis not present

## 2017-05-09 DIAGNOSIS — Z131 Encounter for screening for diabetes mellitus: Secondary | ICD-10-CM | POA: Diagnosis not present

## 2017-05-10 ENCOUNTER — Other Ambulatory Visit: Payer: Self-pay | Admitting: Obstetrics and Gynecology

## 2017-05-10 DIAGNOSIS — Z9889 Other specified postprocedural states: Secondary | ICD-10-CM

## 2017-05-16 DIAGNOSIS — R102 Pelvic and perineal pain: Secondary | ICD-10-CM | POA: Diagnosis not present

## 2017-05-16 DIAGNOSIS — N390 Urinary tract infection, site not specified: Secondary | ICD-10-CM | POA: Diagnosis not present

## 2017-05-16 DIAGNOSIS — Z853 Personal history of malignant neoplasm of breast: Secondary | ICD-10-CM | POA: Diagnosis not present

## 2017-05-16 DIAGNOSIS — R828 Abnormal findings on cytological and histological examination of urine: Secondary | ICD-10-CM | POA: Diagnosis not present

## 2017-08-06 ENCOUNTER — Ambulatory Visit
Admission: RE | Admit: 2017-08-06 | Discharge: 2017-08-06 | Disposition: A | Discharge status: Home / Self Care | Payer: Federal, State, Local not specified - PPO | Source: Ambulatory Visit | Attending: Obstetrics and Gynecology | Admitting: Obstetrics and Gynecology

## 2017-08-06 DIAGNOSIS — R928 Other abnormal and inconclusive findings on diagnostic imaging of breast: Secondary | ICD-10-CM | POA: Diagnosis not present

## 2017-08-06 DIAGNOSIS — Z9889 Other specified postprocedural states: Secondary | ICD-10-CM

## 2017-08-06 HISTORY — DX: Malignant neoplasm of unspecified site of unspecified female breast: C50.919

## 2017-09-24 DIAGNOSIS — K080 Exfoliation of teeth due to systemic causes: Secondary | ICD-10-CM | POA: Diagnosis not present

## 2018-05-30 ENCOUNTER — Other Ambulatory Visit: Payer: Self-pay | Admitting: Obstetrics and Gynecology

## 2018-05-30 DIAGNOSIS — Z853 Personal history of malignant neoplasm of breast: Secondary | ICD-10-CM

## 2018-06-02 ENCOUNTER — Other Ambulatory Visit: Payer: Self-pay | Admitting: Obstetrics and Gynecology

## 2018-06-02 DIAGNOSIS — Z853 Personal history of malignant neoplasm of breast: Secondary | ICD-10-CM

## 2018-06-03 DIAGNOSIS — Z01419 Encounter for gynecological examination (general) (routine) without abnormal findings: Secondary | ICD-10-CM | POA: Diagnosis not present

## 2018-06-03 DIAGNOSIS — Z6838 Body mass index (BMI) 38.0-38.9, adult: Secondary | ICD-10-CM | POA: Diagnosis not present

## 2018-06-11 DIAGNOSIS — E039 Hypothyroidism, unspecified: Secondary | ICD-10-CM | POA: Diagnosis not present

## 2018-06-11 DIAGNOSIS — Z6838 Body mass index (BMI) 38.0-38.9, adult: Secondary | ICD-10-CM | POA: Diagnosis not present

## 2018-06-11 DIAGNOSIS — M1990 Unspecified osteoarthritis, unspecified site: Secondary | ICD-10-CM | POA: Diagnosis not present

## 2018-06-13 DIAGNOSIS — E039 Hypothyroidism, unspecified: Secondary | ICD-10-CM | POA: Diagnosis not present

## 2018-06-13 DIAGNOSIS — Z0000 Encounter for general adult medical examination without abnormal findings: Secondary | ICD-10-CM | POA: Diagnosis not present

## 2018-08-06 DIAGNOSIS — K080 Exfoliation of teeth due to systemic causes: Secondary | ICD-10-CM | POA: Diagnosis not present

## 2018-08-08 ENCOUNTER — Other Ambulatory Visit: Payer: Self-pay

## 2018-08-11 ENCOUNTER — Ambulatory Visit
Admission: RE | Admit: 2018-08-11 | Discharge: 2018-08-11 | Disposition: A | Discharge status: Home / Self Care | Payer: Federal, State, Local not specified - PPO | Source: Ambulatory Visit | Attending: Obstetrics and Gynecology | Admitting: Obstetrics and Gynecology

## 2018-08-11 ENCOUNTER — Ambulatory Visit: Payer: Self-pay

## 2018-08-11 DIAGNOSIS — Z853 Personal history of malignant neoplasm of breast: Secondary | ICD-10-CM | POA: Diagnosis not present

## 2018-08-11 DIAGNOSIS — R928 Other abnormal and inconclusive findings on diagnostic imaging of breast: Secondary | ICD-10-CM | POA: Diagnosis not present

## 2018-10-08 DIAGNOSIS — K080 Exfoliation of teeth due to systemic causes: Secondary | ICD-10-CM | POA: Diagnosis not present

## 2019-06-08 DIAGNOSIS — Z01419 Encounter for gynecological examination (general) (routine) without abnormal findings: Secondary | ICD-10-CM | POA: Diagnosis not present

## 2019-06-08 DIAGNOSIS — Z6839 Body mass index (BMI) 39.0-39.9, adult: Secondary | ICD-10-CM | POA: Diagnosis not present

## 2019-07-14 ENCOUNTER — Other Ambulatory Visit: Payer: Self-pay | Admitting: Hematology and Oncology

## 2019-07-14 DIAGNOSIS — Z9889 Other specified postprocedural states: Secondary | ICD-10-CM

## 2019-07-14 DIAGNOSIS — Z853 Personal history of malignant neoplasm of breast: Secondary | ICD-10-CM

## 2019-08-18 ENCOUNTER — Ambulatory Visit
Admission: RE | Admit: 2019-08-18 | Discharge: 2019-08-18 | Disposition: A | Discharge status: Home / Self Care | Payer: Federal, State, Local not specified - PPO | Source: Ambulatory Visit | Attending: Hematology and Oncology | Admitting: Hematology and Oncology

## 2019-08-18 ENCOUNTER — Other Ambulatory Visit: Payer: Self-pay

## 2019-08-18 DIAGNOSIS — Z9889 Other specified postprocedural states: Secondary | ICD-10-CM

## 2019-08-18 DIAGNOSIS — Z853 Personal history of malignant neoplasm of breast: Secondary | ICD-10-CM

## 2019-12-10 ENCOUNTER — Institutional Professional Consult (permissible substitution): Payer: Federal, State, Local not specified - PPO | Admitting: Plastic Surgery

## 2019-12-15 ENCOUNTER — Telehealth: Payer: Self-pay | Admitting: Plastic Surgery

## 2019-12-15 ENCOUNTER — Ambulatory Visit: Payer: Federal, State, Local not specified - PPO

## 2019-12-15 NOTE — Telephone Encounter (Signed)

## 2019-12-16 ENCOUNTER — Encounter: Payer: Self-pay | Admitting: Plastic Surgery

## 2019-12-16 ENCOUNTER — Other Ambulatory Visit: Payer: Self-pay

## 2019-12-16 ENCOUNTER — Ambulatory Visit (INDEPENDENT_AMBULATORY_CARE_PROVIDER_SITE_OTHER): Payer: Self-pay | Admitting: Plastic Surgery

## 2019-12-16 VITALS — BP 142/82 | HR 75 | Temp 98.7°F | Ht 66.0 in | Wt 241.0 lb

## 2019-12-16 DIAGNOSIS — Z411 Encounter for cosmetic surgery: Secondary | ICD-10-CM

## 2019-12-16 NOTE — Progress Notes (Signed)
Referring Provider Janie Morning, [address] [place],  [place] [address]   CC:  Chief Complaint  Patient presents with  . Advice Only    Patient here for consultation for bilateral under eye bags      Paula Parks is an 64 y.o. female.  HPI: Patient is here to talk about multiple issues.  She is bothered by her under I bags.  She particularly bothered by the groove that splits the transition between her lower lid and cheek.  These bother her for a long time.  She is also bothered by the corners of her mouth particularly on the right side.  She feels that her right lateral commissure droops compared to the left.  She additionally bothered by her overhanging pannus and says she gets rashes in the fold beneath it.  She is primarily concerned with the fullness below the bellybutton.  No Known Allergies  Outpatient Encounter Medications as of 12/16/2019  Medication Sig  . anastrozole (ARIMIDEX) 1 MG tablet Take 1 tablet (1 mg total) by mouth daily.  . Ascorbic Acid (VITAMIN C) 1000 MG tablet Take 1,000 mg by mouth daily.  Marland Kitchen levothyroxine (SYNTHROID, LEVOTHROID) 137 MCG tablet Take 137 mcg by mouth daily before breakfast.  . Misc Natural Products (OSTEO BI-FLEX ADV DOUBLE ST) CAPS Take by mouth.  . Multiple Vitamins-Minerals (MULTIVITAMIN WITH MINERALS) tablet Take 1 tablet by mouth daily.  . traMADol (ULTRAM) 50 MG tablet Take by mouth every 6 (six) hours as needed.  . Vitamin D, Ergocalciferol, (DRISDOL) 50000 UNITS CAPS capsule Take 10,000 Units by mouth every 7 (seven) days.   No facility-administered encounter medications on file as of 12/16/2019.     Past Medical History:  Diagnosis Date  . Arthritis   . Breast cancer ([place])   . Hypothyroidism   . No blood products   . Patient is Jehovah's Witness     Past Surgical History:  Procedure Laterality Date  . BREAST EXCISIONAL BIOPSY Left   . BREAST LUMPECTOMY Right 2015  . BREAST LUMPECTOMY WITH  RADIOACTIVE SEED LOCALIZATION Right 08/04/2014   Procedure: RIGHT BREAST SEED LOCALIZED  LUMPECTOMY ;  Surgeon: Erroll Luna, MD;  Location: [place];  Service: General;  Laterality: Right;  . COLONOSCOPY    . fibrocystic breast Left   . INGUINAL HERNIA REPAIR     right-as infant  . TUBAL LIGATION      Family History  Problem Relation Age of Onset  . Breast cancer Mother 57  . Breast cancer Maternal Aunt   . Liver cancer Maternal Uncle   . Breast cancer Sister 53    Social History   Social History Narrative  . Not on file  Denies tobacco use  Review of Systems General: Denies fevers, chills, weight loss CV: Denies chest pain, shortness of breath, palpitations  Physical Exam Vitals with BMI 12/16/2019 07/12/2015 02/21/2015  Height 5\' 6"  5\' 7"  5\' 7"   Weight 241 lbs 240 lbs 5 oz 235 lbs 2 oz  BMI 38.92 0000000 [id number]  Systolic [id number] [id number] A999333  Diastolic 82 81 75  Pulse 75 95 88    General:  No acute distress,  Alert and oriented, Non-Toxic, Normal speech and affect HEENT: Normocephalic atraumatic.  Extraocular is intact.  She has a deep tear trough extending laterally with prominent under eye bags.  She does have a slight asymmetry in between the 2 oral commissures with a slight downturning on both sides.  Assessment/Plan  Patient presents with complaints of multiple issues.  Regarding the under eye bags my primary recommendation for her was a lower lid blepharoplasty to divide the orbit and malar ligament and reposition the fat.  I think this would be the best way to address this for her.  I did explain that there was some risk involved in terms of the positioning of the lower lid.  A secondary option would be filler that could be injected in that area which may blend the transition between the lid and the cheek a bit.  Regarding her oral commissures for the downturning I suggested Botox to the depressor anguli oris on both sides coupled with some filler to smooth out any  asymmetries in that area.  Regarding the pannus if she is interested in pursuing this she may qualify for an insurance panniculectomy given the rashes in the fold.  She also wanted a cosmetic quote for panniculectomy in the event that this was not covered by insurance but does not want to pursue this just yet as she is getting married in the next few months.  Cindra Presume 12/16/2019, 3:43 PM

## 2019-12-21 ENCOUNTER — Ambulatory Visit: Payer: Federal, State, Local not specified - PPO

## 2019-12-24 ENCOUNTER — Ambulatory Visit: Payer: Federal, State, Local not specified - PPO

## 2019-12-25 ENCOUNTER — Ambulatory Visit: Payer: Federal, State, Local not specified - PPO

## 2020-08-01 ENCOUNTER — Other Ambulatory Visit: Payer: Self-pay | Admitting: Obstetrics and Gynecology

## 2020-08-01 DIAGNOSIS — Z803 Family history of malignant neoplasm of breast: Secondary | ICD-10-CM

## 2020-08-14 ENCOUNTER — Ambulatory Visit
Admission: RE | Admit: 2020-08-14 | Discharge: 2020-08-14 | Disposition: A | Discharge status: Home / Self Care | Payer: Federal, State, Local not specified - PPO | Source: Ambulatory Visit | Attending: Obstetrics and Gynecology | Admitting: Obstetrics and Gynecology

## 2020-08-14 DIAGNOSIS — Z803 Family history of malignant neoplasm of breast: Secondary | ICD-10-CM

## 2020-08-14 MED ORDER — GADOBUTROL 1 MMOL/ML IV SOLN
10.0000 mL | Freq: Once | INTRAVENOUS | Status: AC | PRN
  Administered 2020-08-14: 10 mL via INTRAVENOUS

## 2020-08-15 ENCOUNTER — Telehealth: Payer: Self-pay | Admitting: Hematology and Oncology

## 2020-08-15 NOTE — Telephone Encounter (Signed)
I informed her of the breast MRI result. No evidence of malignancy. For next year onwards we can go back to annual mammograms.

## 2021-05-16 IMAGING — MR MR BREAST WO/W CM  BILAT
5 series · 30 of 48 positions shown · IV contrast (10 ml gadavist)
Comparison: Prior mammogram

CLINICAL DATA: Abbreviated Breast MRI for breast cancer screening.
TECHNIQUE: Multiplanar, multisequence MR images of both breasts were obtained
prior to and following the intravenous administration of 10 ml of
Gadavist

[Series 2: t2_tirm_tra ipat (a-p) · axial · 3.0mm · 0.74mm/px · z∈[-67,+110]mm · 5 of 60 slices shown]
[im 1/60]
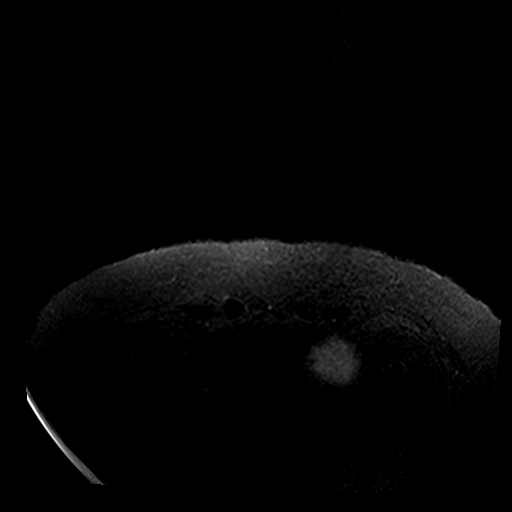
[im 15/60]
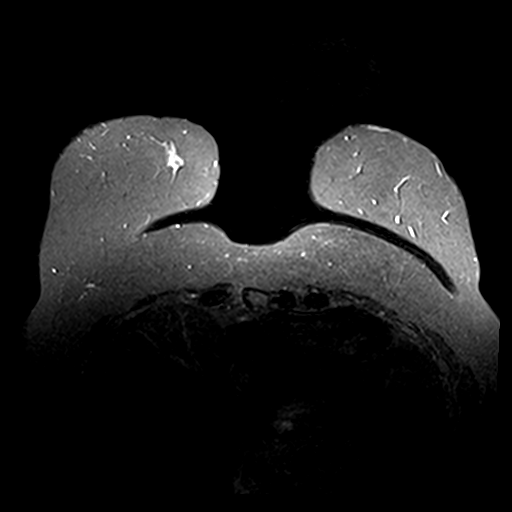
[im 30/60]
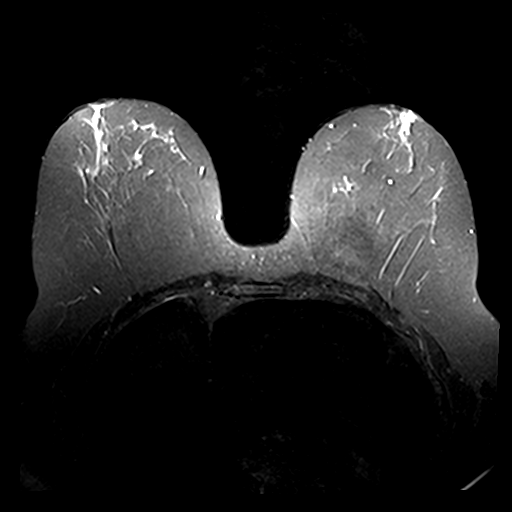
[im 45/60]
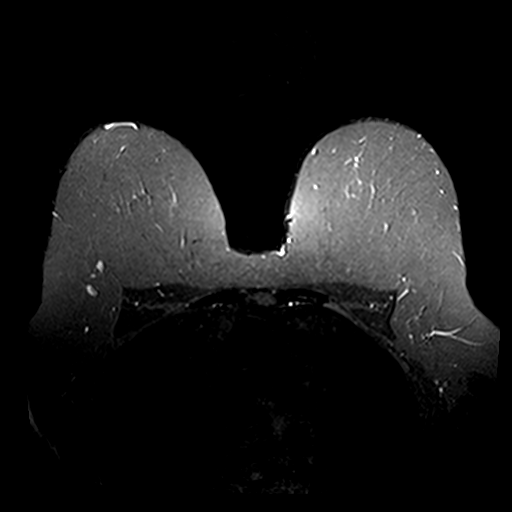
[im 60/60]
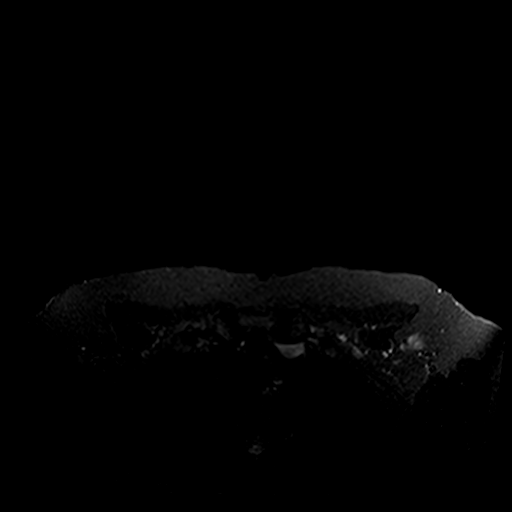

[Series 3: fl3d pre-cm · axial · non-contrast · 1.2mm · 0.99mm/px · z∈[-64,+108]mm · 8 of 144 slices shown]
[im 1/144]
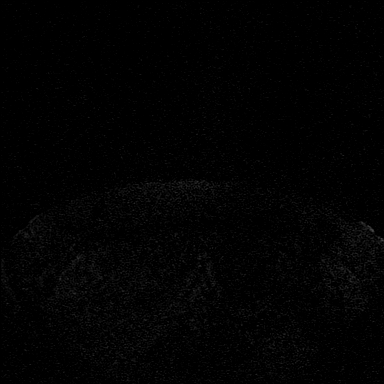
[im 23/144]
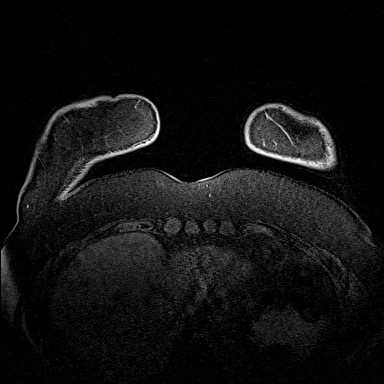
[im 45/144]
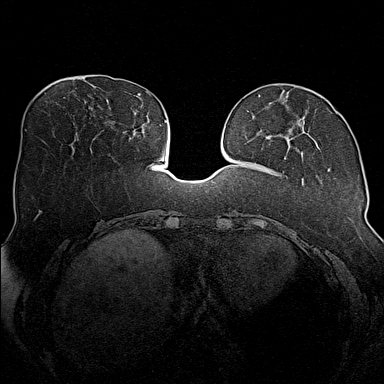
[im 67/144]
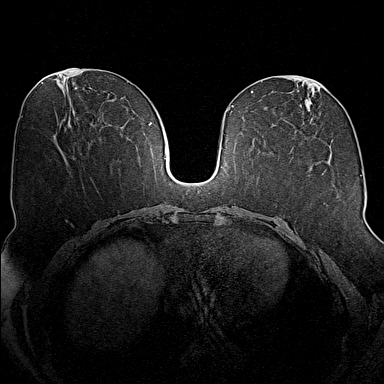
[im 78/144]
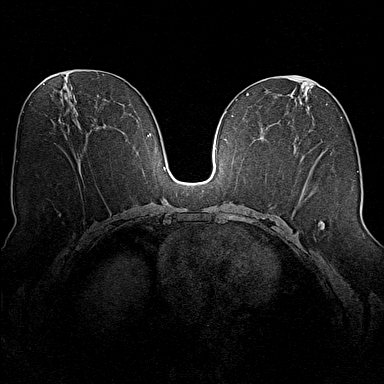
[im 100/144]
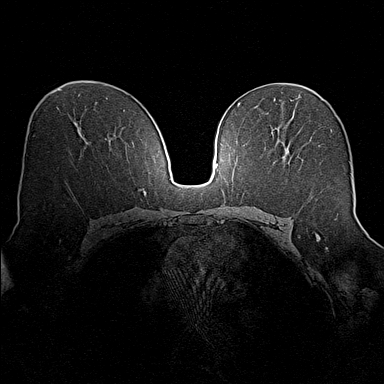
[im 122/144]
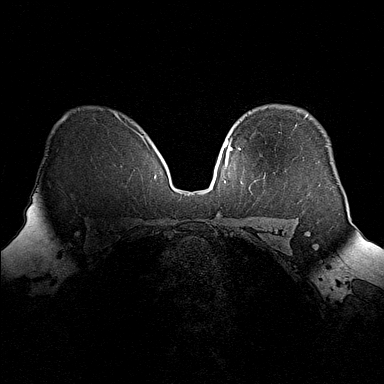
[im 144/144]
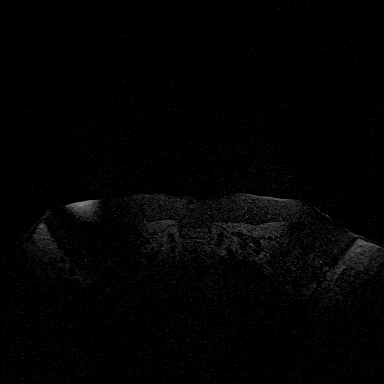

[Series 4: fl3d post-cm 20 · axial · 1.2mm · 0.99mm/px · z∈[-64,+108]mm · 8 of 144 slices shown (1 of 3)]
[im 1/144]
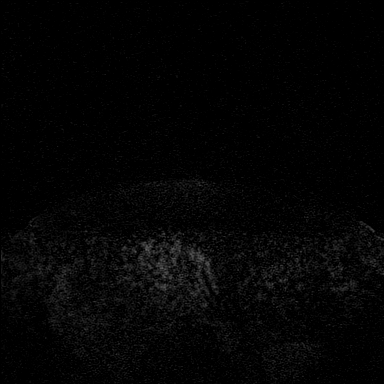
[im 23/144]
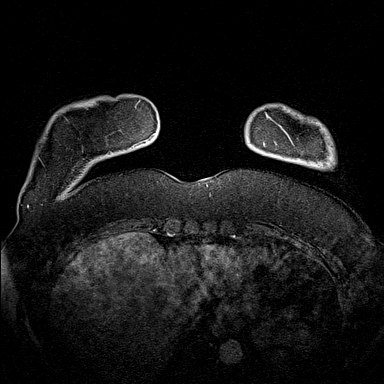
[im 45/144]
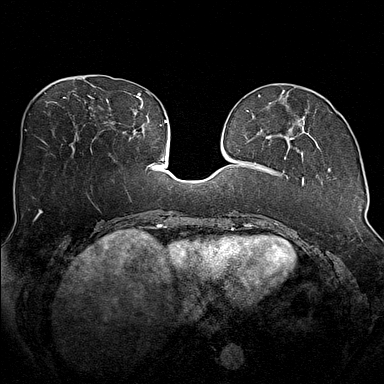
[im 67/144]
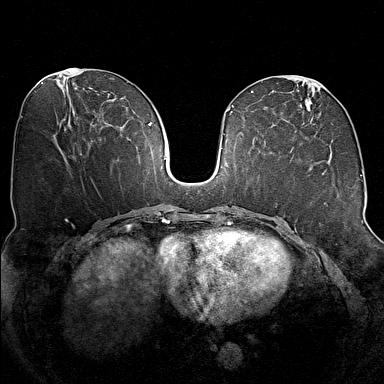
[im 78/144]
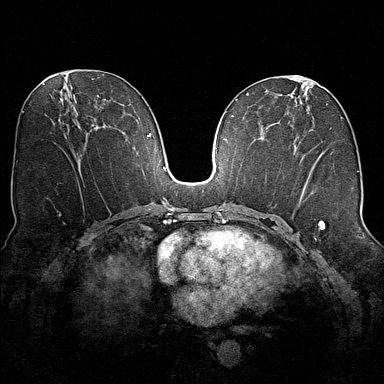
[im 100/144]
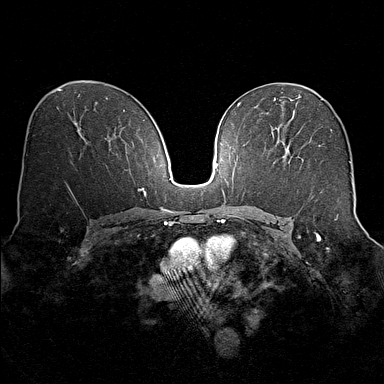
[im 122/144]
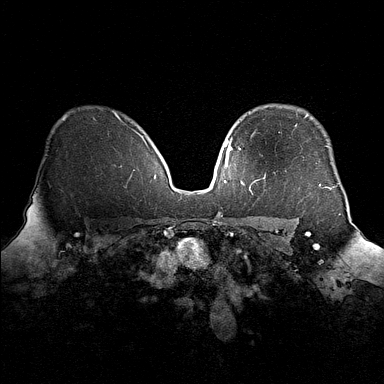
[im 144/144]
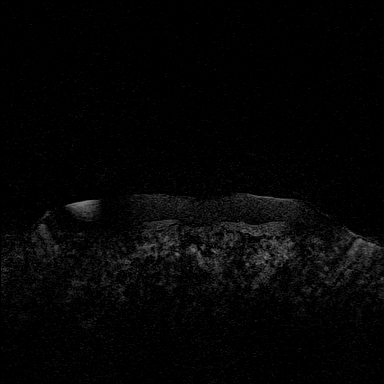

[Series 5: fl3d post-cm 20 · axial · 1.2mm · 0.99mm/px · z∈[-64,+108]mm · 8 of 144 slices shown (2 of 3)]
[im 1/144]
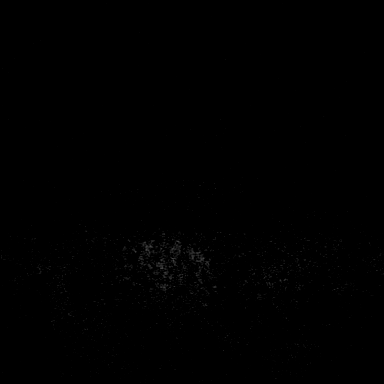
[im 23/144]
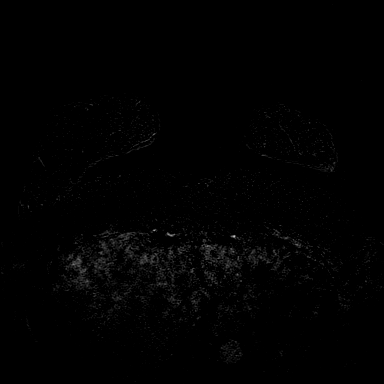
[im 45/144]
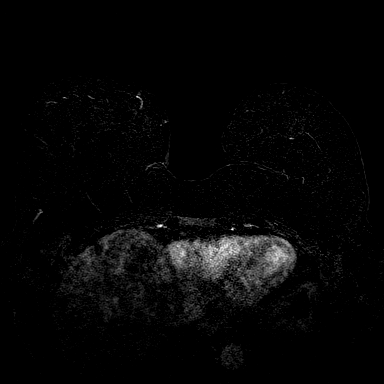
[im 67/144]
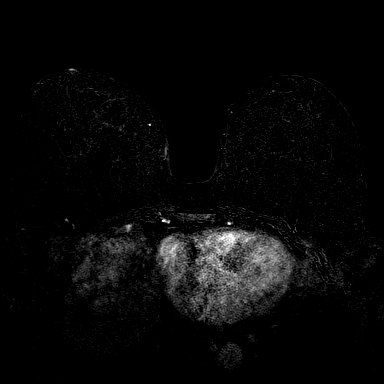
[im 78/144]
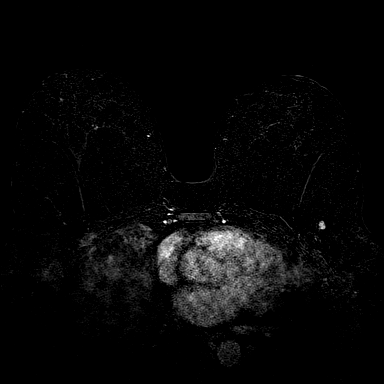
[im 100/144]
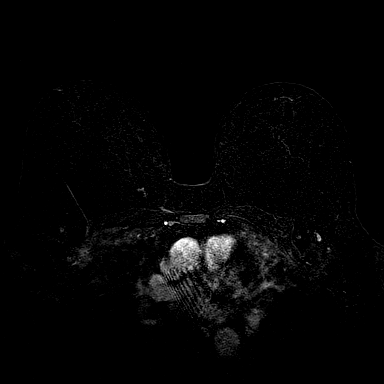
[im 122/144]
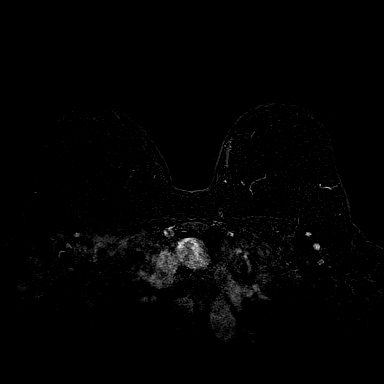
[im 144/144]
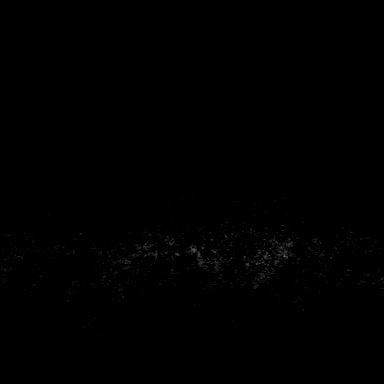

[Series 6: fl3d post-cm 20 · axial · 172.8mm · 0.99mm/px · 1 of 1 slices shown (3 of 3)]
[im 1/1]
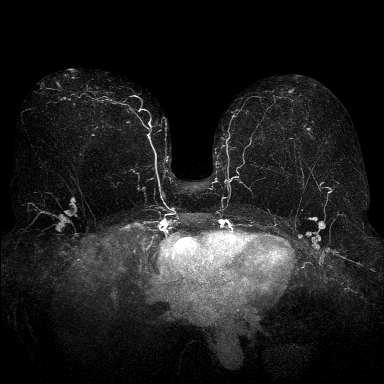

[30 of 48 positions shown; findings below may reference images not displayed]

Supplemental breast MRI screening. Patient underwent a stereotactic
core needle biopsy on 06/21/2014 revealing high grade ductal
carcinoma in situ with comedonecrosis. She underwent a lumpectomy on
08/04/2014. Patient has a family history of breast carcinoma, mother
at age 85 and sister at age 66. History of intermittent left breast
pain.

LABS:  No labs drawn at time of imaging.

EXAM:
BILATERAL ABBREVIATED BREAST MRI WITH AND WITHOUT CONTRAST
Three-dimensional MR images were rendered by post-processing of the
original MR data on an independent workstation. The
three-dimensional MR images were interpreted, and findings are
reported in the following complete MRI report for this study. Three
dimensional images were evaluated at the independent [employer]
workstation
FINDINGS: Breast composition: b. Scattered fibroglandular tissue.

Background parenchymal enhancement: Mild.

Right breast: No mass or abnormal enhancement.

Left breast: No mass or abnormal enhancement.

Lymph nodes: No abnormal appearing lymph nodes.

Ancillary findings:  None.
IMPRESSION: 1. No evidence of breast malignancy.

RECOMMENDATION:
Recommend annual screening mammography. The patient likely qualifies
for annual standard screening breast MRI, assuming that she has a
greater than 20% lifetime risk developing additional breast
carcinoma, given her personal history of breast carcinoma and her
family history. The patient is also eligible for annual Abbreviated
Breast MRI if she wishes.

BI-RADS CATEGORY  1: Negative.
